# Patient Record
Sex: Male | Born: 1945 | Race: White | Hispanic: No | Smoking: Current every day smoker
Health system: Southern US, Community
[De-identification: ages and names within clinical notes are randomized; demographics above are authoritative.]

## PROBLEM LIST (undated history)

## (undated) DIAGNOSIS — I10 Essential (primary) hypertension: Secondary | ICD-10-CM

## (undated) DIAGNOSIS — E119 Type 2 diabetes mellitus without complications: Secondary | ICD-10-CM

## (undated) DIAGNOSIS — K449 Diaphragmatic hernia without obstruction or gangrene: Secondary | ICD-10-CM

## (undated) DIAGNOSIS — R0600 Dyspnea, unspecified: Secondary | ICD-10-CM

## (undated) DIAGNOSIS — K209 Esophagitis, unspecified without bleeding: Secondary | ICD-10-CM

## (undated) DIAGNOSIS — K2980 Duodenitis without bleeding: Secondary | ICD-10-CM

## (undated) DIAGNOSIS — C801 Malignant (primary) neoplasm, unspecified: Secondary | ICD-10-CM

## (undated) HISTORY — PX: CHOLECYSTECTOMY: SHX55

## (undated) HISTORY — PX: OTHER SURGICAL HISTORY: SHX169

## (undated) HISTORY — PX: EYE SURGERY: SHX253

---

## 2016-11-19 ENCOUNTER — Emergency Department (HOSPITAL_COMMUNITY): Payer: Medicare PPO

## 2016-11-19 ENCOUNTER — Observation Stay (HOSPITAL_COMMUNITY)
Admission: EM | Admit: 2016-11-19 | Discharge: 2016-11-21 | Disposition: A | Discharge status: Home / Self Care | Payer: Medicare PPO | Attending: Family Medicine | Admitting: Family Medicine

## 2016-11-19 ENCOUNTER — Encounter (HOSPITAL_COMMUNITY): Payer: Self-pay | Admitting: Emergency Medicine

## 2016-11-19 DIAGNOSIS — F17210 Nicotine dependence, cigarettes, uncomplicated: Secondary | ICD-10-CM | POA: Insufficient documentation

## 2016-11-19 DIAGNOSIS — Z515 Encounter for palliative care: Secondary | ICD-10-CM

## 2016-11-19 DIAGNOSIS — E119 Type 2 diabetes mellitus without complications: Secondary | ICD-10-CM | POA: Diagnosis not present

## 2016-11-19 DIAGNOSIS — E43 Unspecified severe protein-calorie malnutrition: Secondary | ICD-10-CM | POA: Diagnosis not present

## 2016-11-19 DIAGNOSIS — C189 Malignant neoplasm of colon, unspecified: Principal | ICD-10-CM | POA: Insufficient documentation

## 2016-11-19 DIAGNOSIS — Z79899 Other long term (current) drug therapy: Secondary | ICD-10-CM | POA: Insufficient documentation

## 2016-11-19 DIAGNOSIS — C787 Secondary malignant neoplasm of liver and intrahepatic bile duct: Secondary | ICD-10-CM | POA: Insufficient documentation

## 2016-11-19 DIAGNOSIS — D500 Iron deficiency anemia secondary to blood loss (chronic): Secondary | ICD-10-CM | POA: Diagnosis present

## 2016-11-19 DIAGNOSIS — D649 Anemia, unspecified: Secondary | ICD-10-CM | POA: Insufficient documentation

## 2016-11-19 DIAGNOSIS — N179 Acute kidney failure, unspecified: Secondary | ICD-10-CM | POA: Diagnosis present

## 2016-11-19 DIAGNOSIS — Z7189 Other specified counseling: Secondary | ICD-10-CM

## 2016-11-19 DIAGNOSIS — Z7982 Long term (current) use of aspirin: Secondary | ICD-10-CM | POA: Insufficient documentation

## 2016-11-19 DIAGNOSIS — R531 Weakness: Secondary | ICD-10-CM | POA: Diagnosis present

## 2016-11-19 HISTORY — DX: Esophagitis, unspecified: K20.9

## 2016-11-19 HISTORY — DX: Diaphragmatic hernia without obstruction or gangrene: K44.9

## 2016-11-19 HISTORY — DX: Type 2 diabetes mellitus without complications: E11.9

## 2016-11-19 HISTORY — DX: Esophagitis, unspecified without bleeding: K20.90

## 2016-11-19 HISTORY — DX: Duodenitis without bleeding: K29.80

## 2016-11-19 LAB — CBC WITH DIFFERENTIAL/PLATELET
BASOS PCT: 0 %
Basophils Absolute: 0 10*3/uL (ref 0.0–0.1)
EOS ABS: 0.1 10*3/uL (ref 0.0–0.7)
Eosinophils Relative: 1 %
HCT: 34.6 % — ABNORMAL LOW (ref 39.0–52.0)
Hemoglobin: 11.6 g/dL — ABNORMAL LOW (ref 13.0–17.0)
Lymphocytes Relative: 10 %
Lymphs Abs: 1.2 10*3/uL (ref 0.7–4.0)
MCH: 29.7 pg (ref 26.0–34.0)
MCHC: 33.5 g/dL (ref 30.0–36.0)
MCV: 88.7 fL (ref 78.0–100.0)
MONO ABS: 1.2 10*3/uL — AB (ref 0.1–1.0)
MONOS PCT: 10 %
NEUTROS PCT: 79 %
Neutro Abs: 8.7 10*3/uL — ABNORMAL HIGH (ref 1.7–7.7)
Platelets: 334 10*3/uL (ref 150–400)
RBC: 3.9 MIL/uL — ABNORMAL LOW (ref 4.22–5.81)
RDW: 12.7 % (ref 11.5–15.5)
WBC: 11.2 10*3/uL — ABNORMAL HIGH (ref 4.0–10.5)

## 2016-11-19 LAB — URINALYSIS, ROUTINE W REFLEX MICROSCOPIC
BILIRUBIN URINE: NEGATIVE
GLUCOSE, UA: NEGATIVE mg/dL
HGB URINE DIPSTICK: NEGATIVE
Ketones, ur: NEGATIVE mg/dL
Leukocytes, UA: NEGATIVE
Nitrite: NEGATIVE
PH: 5 (ref 5.0–8.0)
Protein, ur: NEGATIVE mg/dL
SPECIFIC GRAVITY, URINE: 1.013 (ref 1.005–1.030)

## 2016-11-19 LAB — COMPREHENSIVE METABOLIC PANEL
ALK PHOS: 297 U/L — AB (ref 38–126)
ALT: 25 U/L (ref 17–63)
AST: 47 U/L — AB (ref 15–41)
Albumin: 2.6 g/dL — ABNORMAL LOW (ref 3.5–5.0)
Anion gap: 16 — ABNORMAL HIGH (ref 5–15)
BUN: 43 mg/dL — AB (ref 6–20)
CALCIUM: 9.1 mg/dL (ref 8.9–10.3)
CO2: 22 mmol/L (ref 22–32)
CREATININE: 1.46 mg/dL — AB (ref 0.61–1.24)
Chloride: 93 mmol/L — ABNORMAL LOW (ref 101–111)
GFR calc non Af Amer: 47 mL/min — ABNORMAL LOW (ref >60)
GFR, EST AFRICAN AMERICAN: 55 mL/min — AB (ref >60)
GLUCOSE: 226 mg/dL — AB (ref 65–99)
Potassium: 3.4 mmol/L — ABNORMAL LOW (ref 3.5–5.1)
SODIUM: 131 mmol/L — AB (ref 135–145)
Total Bilirubin: 2.3 mg/dL — ABNORMAL HIGH (ref 0.3–1.2)
Total Protein: 6.9 g/dL (ref 6.5–8.1)

## 2016-11-19 LAB — LIPASE, BLOOD: Lipase: 30 U/L (ref 11–51)

## 2016-11-19 LAB — MAGNESIUM: Magnesium: 1.1 mg/dL — ABNORMAL LOW (ref 1.7–2.4)

## 2016-11-19 MED ORDER — PANTOPRAZOLE SODIUM 40 MG PO TBEC
40.0000 mg | DELAYED_RELEASE_TABLET | Freq: Every day | ORAL | Status: DC
  Administered 2016-11-20 – 2016-11-21 (×2): 40 mg via ORAL
  Filled 2016-11-19 (×2): qty 1

## 2016-11-19 MED ORDER — INSULIN ASPART 100 UNIT/ML ~~LOC~~ SOLN
0.0000 [IU] | Freq: Three times a day (TID) | SUBCUTANEOUS | Status: DC
  Administered 2016-11-20: 3 [IU] via SUBCUTANEOUS
  Administered 2016-11-20 – 2016-11-21 (×2): 2 [IU] via SUBCUTANEOUS

## 2016-11-19 MED ORDER — ENOXAPARIN SODIUM 40 MG/0.4ML ~~LOC~~ SOLN
40.0000 mg | SUBCUTANEOUS | Status: DC
  Administered 2016-11-19 – 2016-11-20 (×2): 40 mg via SUBCUTANEOUS
  Filled 2016-11-19 (×2): qty 0.4

## 2016-11-19 MED ORDER — IOPAMIDOL (ISOVUE-300) INJECTION 61%
80.0000 mL | Freq: Once | INTRAVENOUS | Status: AC | PRN
  Administered 2016-11-19: 80 mL via INTRAVENOUS

## 2016-11-19 MED ORDER — SODIUM CHLORIDE 0.9 % IV BOLUS (SEPSIS)
1000.0000 mL | Freq: Once | INTRAVENOUS | Status: AC
  Administered 2016-11-19: 1000 mL via INTRAVENOUS

## 2016-11-19 MED ORDER — ONDANSETRON HCL 4 MG/2ML IJ SOLN
4.0000 mg | Freq: Four times a day (QID) | INTRAMUSCULAR | Status: DC | PRN
  Administered 2016-11-20: 4 mg via INTRAVENOUS
  Filled 2016-11-19: qty 2

## 2016-11-19 MED ORDER — ACETAMINOPHEN 650 MG RE SUPP: 650.0000 mg | Freq: Four times a day (QID) | RECTAL | Status: DC | PRN

## 2016-11-19 MED ORDER — ONDANSETRON HCL 4 MG PO TABS: 4.0000 mg | ORAL_TABLET | Freq: Four times a day (QID) | ORAL | Status: DC | PRN

## 2016-11-19 MED ORDER — ENSURE ENLIVE PO LIQD
237.0000 mL | Freq: Two times a day (BID) | ORAL | Status: DC
  Administered 2016-11-20: 237 mL via ORAL

## 2016-11-19 MED ORDER — POTASSIUM CHLORIDE IN NACL 20-0.9 MEQ/L-% IV SOLN
INTRAVENOUS | Status: DC
  Administered 2016-11-19 – 2016-11-21 (×4): via INTRAVENOUS

## 2016-11-19 MED ORDER — ACETAMINOPHEN 325 MG PO TABS
650.0000 mg | ORAL_TABLET | Freq: Four times a day (QID) | ORAL | Status: DC | PRN
  Administered 2016-11-19: 650 mg via ORAL
  Filled 2016-11-19: qty 2

## 2016-11-19 NOTE — ED Notes (Signed)
Patient transported to CT 

## 2016-11-19 NOTE — ED Notes (Signed)
ED Provider at bedside. 

## 2016-11-19 NOTE — ED Provider Notes (Signed)
[facility] DEPT Provider Note   CSN: [id number] Arrival date & time: 11/19/16  1338     History   Chief Complaint Chief Complaint  Patient presents with  . Weakness    HPI Cory Robinson is a 71 y.o. male.  HPI  71 year old male, he reports a history of high blood pressure and high cholesterol, he does not have a family doctor, he lives in [name], he has been trying to get some evaluation over the last 6 weeks for what he describes as a lack of appetite, he has extreme early satiety and really has no desire to eat or drink anything. He has been losing weight, he feels as though he has been having excessive amounts of gas and gas pains which come and go, no difficulty urinating, no blood in the stools, he has excessive diarrhea though sometimes he is constipated.  In the early part of the evaluation over the last 6 weeks he had blood work done, he was not informed of the results, he had an ultrasound of his abdomen, he states he was not informed of the results. He had an endoscopy and was told that he had to start a new medication and was given dicyclomine as well as Protonix but again he states he was not given any information regarding his diagnoses. That being said his physicians have continued to order tests to further evaluate him, he reports he is set up to see a blood specialist though he does not know why, he has been seeing a gastroenterologist who has been evaluating his abdomen but whom he says has not communicated with him the results of these findings.  He presents today stating that he is tired of being sick which she has been for the last 6 weeks, he reports that being sick means that he doesn't want to eat and has cramping in his abdomen. He denies being jaundice, denies vomiting, denies swelling of his abdomen. He does report a weight loss, generalized weakness and nausea which is sometimes improved with Phenergan. The Phenergan was started after he told his  doctor that he was not improving with dicyclomine.  The patient has had a cholecystectomy in the past, he has never had any other abdominal surgery, denies a history of cancer.  Past Medical History:  Diagnosis Date  . Diabetes mellitus without complication ([name])   . Duodenitis   . Esophagitis   . Hiatal hernia     There are no active problems to display for this patient.   Past Surgical History:  Procedure Laterality Date  . CHOLECYSTECTOMY    . EYE SURGERY         Home Medications    Prior to Admission medications   Not on File    Family History History reviewed. No pertinent family history.  Social History Social History  Substance Use Topics  . Smoking status: Current Every Day Smoker    Packs/day: 1.00    Types: Cigarettes  . Smokeless tobacco: Never Used  . Alcohol use Yes     Comment: daily until 3 months ago     Allergies   Patient has no known allergies.   Review of Systems Review of Systems  All other systems reviewed and are negative.    Physical Exam Updated Vital Signs BP 110/70 (BP Location: Right Arm)   Pulse 81   Temp 98.1 F (36.7 C) (Oral)   Resp 20   Ht 5\' 7"  (1.702 m)   Wt 63.1 kg (  139 lb 3.2 oz)   SpO2 100%   BMI 21.80 kg/m   Physical Exam  Constitutional: He appears well-developed and well-nourished. No distress.  HENT:  Head: Normocephalic and atraumatic.  Mouth/Throat: Oropharynx is clear and moist. No oropharyngeal exudate.  Eyes: Pupils are equal, round, and reactive to light. Conjunctivae and EOM are normal. Right eye exhibits no discharge. Left eye exhibits no discharge. No scleral icterus.  Neck: Normal range of motion. Neck supple. No JVD present. No thyromegaly present.  Cardiovascular: Normal rate, regular rhythm, normal heart sounds and intact distal pulses.  Exam reveals no gallop and no friction rub.   No murmur heard. Pulmonary/Chest: Effort normal and breath sounds normal. No respiratory distress. He has  no wheezes. He has no rales.  Abdominal: Soft. Bowel sounds are normal. He exhibits no distension and no mass. There is no tenderness.  The patient has mild tenderness across the upper abdomen without guarding, there is no tympanitic sounds to percussion, no lower abdominal tenderness fullness or masses. No hernias in the inguinal areas. Bowel sounds are slightly increased  Musculoskeletal: Normal range of motion. He exhibits no edema or tenderness.  Lymphadenopathy:    He has no cervical adenopathy.  Neurological: He is alert. Coordination normal.  Skin: Skin is warm and dry. No rash noted. No erythema.  Psychiatric: He has a normal mood and affect. His behavior is normal.  Nursing note and vitals reviewed.    ED Treatments / Results  Labs (all labs ordered are listed, but only abnormal results are displayed) Labs Reviewed - No data to display   Radiology No results found.  Procedures Procedures (including critical care time)  Medications Ordered in ED Medications - No data to display   Initial Impression / Assessment and Plan / ED Course  I have reviewed the triage vital signs and the nursing notes.  Pertinent labs & imaging results that were available during my care of the patient were reviewed by me and considered in my medical decision making (see chart for details).     The patient's exam is rather unremarkable, he does have some upper abdominal tenderness but does not a surgical abdomen, the rest of his exam is benign with normal heart and lung sounds. Vital signs reflect no hypertension, hypotension, tachycardia, fever, tachypnea or hypoxia. We'll obtain labs and perform CT scan of the abdomen and pelvis to rule out cancer or other intra-abdominal sources of the patient's symptoms. He is well aware that we may not find any answers in which case he is willing to follow-up in the outpatient setting and continue his outpatient workup. Unfortunately we do not have access to  his prior testing or labs thus we will need to repeat some of these.  Slight WBC, Slight Anemia Lytes with mild hyponatremia and mild hypokalemia Cr at 1.46, BUN 40's Needs fluids - given LFT's are up slightly including bili  Change of shift - care signed out to Dr. Roderic Palau to f/u results and disposition accordingly.   Final Clinical Impressions(s) / ED Diagnoses   Final diagnoses:  None    New Prescriptions New Prescriptions   No medications on file     Noemi Chapel, MD 11/19/16 [phone]

## 2016-11-19 NOTE — ED Triage Notes (Signed)
Pt reports "I am just sick."  States he has been unable to eat since August and has been seeing doctors in [place] and does not have any answers.  Upper GI study showed esophagitis, duodenitis, and a hiatal hernia.  Cannot get in touch with his doctor due to power outage.  Does not have pcp.

## 2016-11-19 NOTE — H&P (Signed)
[facility] H&P    Patient Demographics:    Cory Robinson, is a 71 y.o. male  MRN: [id number]  DOB - July 08, 1945  Admit Date - 11/19/2016  Referring MD/NP/PA: Dr Roderic Palau  Outpatient Primary MD for the patient is Patient, [name] Per  Patient coming from: Home   Chief Complaint  Patient presents with  . Weakness      HPI:    Cory Robinson  is a 71 y.o. male,with history of hypertension, hyperlipidemia came to hospital after he has been having anorexia, weight loss, nausea since June of this year.patient also complains of intermittent abdominal pain, diarrhea alternating with constipation.  He denies vomiting. Denies chest pain Complains of shortness of breath on exertion Patient was seen by GI, underwent endoscopy, and was started on dicyclomine and Protonix.  In the ED today, patient was found to be in mild acute kidney injury. CT of the abdomen showed circumferential mass in the sigmoid colon consistent with colorectal carcinoma. Mass locally invasive into adjacent sigmoid colon. Widespread hepatic metastasis involving left and right hepatic lobes   Review of systems:       All other systems reviewed and are negative.   With Past History of the following :    Past Medical History:  Diagnosis Date  . Diabetes mellitus without complication ([place])   . Duodenitis   . Esophagitis   . Hiatal hernia       Past Surgical History:  Procedure Laterality Date  . CHOLECYSTECTOMY    . EYE SURGERY        Social History:      Social History  Substance Use Topics  . Smoking status: Current Every Day Smoker    Packs/day: 1.00    Types: Cigarettes  . Smokeless tobacco: Never Used  . Alcohol use Yes     Comment: daily until 3 months ago       Family History :    patient's mother had colon cancer, father had lymphoma   Home Medications:   Prior to Admission medications   Medication Sig Start  Date End Date Taking? Authorizing Provider  dicyclomine (BENTYL) 10 MG capsule Take 10 mg by mouth 4 (four) times daily -  before meals and at bedtime.  11/01/16  Yes [provider]  fluticasone (FLONASE) 50 MCG/ACT nasal spray Place 2 sprays into both nostrils daily.  08/23/16  Yes [provider]  losartan-hydrochlorothiazide (HYZAAR) 100-25 MG tablet Take 1 tablet by mouth daily.  01/26/09  Yes [provider]  pantoprazole (PROTONIX) 40 MG tablet Take 40 mg by mouth daily.  10/25/16  Yes [provider]  sildenafil (VIAGRA) 100 MG tablet Take 100 mg by mouth as needed.  01/08/09  Yes [provider]     Allergies:    No Known Allergies   Physical Exam:   Vitals  Blood pressure (!) 124/93, pulse 79, temperature 98.1 F (36.7 C), temperature source Oral, resp. rate 14, height 5\' 7"  (1.702 m), weight 63.1 kg (139 lb 3.2 oz), SpO2 100 %.  1.  General: Appears in no acute distress  2. Psychiatric:  Intact judgement and  insight, awake alert, oriented x 3.  3. Neurologic: No focal neurological deficits, all cranial nerves intact.Strength 5/5 all 4 extremities, sensation intact all 4 extremities, plantars down going.  4. Eyes :  anicteric sclerae, moist conjunctivae with no lid lag. PERRLA.  5. ENMT:  Oropharynx clear with moist mucous membranes and good dentition  6. Neck:  supple, no cervical lymphadenopathy appriciated, No thyromegaly  7. Respiratory : Normal respiratory effort, good air movement bilaterally,clear to  auscultation bilaterally  8. Cardiovascular : RRR, no gallops, rubs or murmurs, no leg edema  9. Gastrointestinal:  Positive bowel sounds, abdomen soft, non-tender to palpation,no hepatosplenomegaly, no rigidity or guarding       10. Skin:  No cyanosis, normal texture and turgor, no rash, lesions or ulcers  11.Musculoskeletal:  Good muscle tone,  joints appear normal , no effusions,  normal range of motion     Data Review:    CBC  Recent Labs Lab 11/19/16 1408  WBC 11.2*  HGB 11.6*  HCT 34.6*  PLT 334  MCV 88.7  MCH 29.7  MCHC 33.5  RDW 12.7  LYMPHSABS 1.2  MONOABS 1.2*  EOSABS 0.1  BASOSABS 0.0   ------------------------------------------------------------------------------------------------------------------  Chemistries   Recent Labs Lab 11/19/16 1408  NA 131*  K 3.4*  CL 93*  CO2 22  GLUCOSE 226*  BUN 43*  CREATININE 1.46*  CALCIUM 9.1  AST 47*  ALT 25  ALKPHOS 297*  BILITOT 2.3*   ------------------------------------------------------------------------------------------------------------------  ------------------------------------------------------------------------------------------------------------------ GFR: Estimated Creatinine Clearance: 42.6 mL/min (A) (by C-G formula based on SCr of 1.46 mg/dL (H)). Liver Function Tests:  Recent Labs Lab 11/19/16 1408  AST 47*  ALT 25  ALKPHOS 297*  BILITOT 2.3*  PROT 6.9  ALBUMIN 2.6*    Recent Labs Lab 11/19/16 1408  LIPASE 30   No results for input(s): AMMONIA in the last 168 hours. Coagulation Profile: No results for input(s): INR, PROTIME in the last 168 hours. Cardiac Enzymes: No results for input(s): CKTOTAL, CKMB, CKMBINDEX, TROPONINI in the last 168 hours. BNP (last 3 results) No results for input(s): PROBNP in the last 8760 hours. HbA1C: No results for input(s): HGBA1C in the last 72 hours. CBG: No results for input(s): GLUCAP in the last 168 hours. Lipid Profile: No results for input(s): CHOL, HDL, LDLCALC, TRIG, CHOLHDL, LDLDIRECT in the last 72 hours. Thyroid Function Tests: No results for input(s): TSH, T4TOTAL, FREET4, T3FREE, THYROIDAB in the last 72 hours. Anemia Panel: No results for input(s): VITAMINB12, FOLATE, FERRITIN, TIBC, IRON, RETICCTPCT in the last 72  hours.  --------------------------------------------------------------------------------------------------------------- Urine analysis:    Component Value Date/Time   COLORURINE YELLOW 11/19/2016 1410   APPEARANCEUR CLEAR 11/19/2016 1410   LABSPEC 1.013 11/19/2016 1410   PHURINE 5.0 11/19/2016 1410   GLUCOSEU NEGATIVE 11/19/2016 1410   HGBUR NEGATIVE 11/19/2016 1410   BILIRUBINUR NEGATIVE 11/19/2016 1410   KETONESUR NEGATIVE 11/19/2016 1410   PROTEINUR NEGATIVE 11/19/2016 1410   NITRITE NEGATIVE 11/19/2016 1410   LEUKOCYTESUR NEGATIVE 11/19/2016 1410      Imaging Results:    Ct Abdomen Pelvis W Contrast  Result Date: 11/19/2016 CLINICAL DATA:  Loss of appetite.  30 pound weight loss over 6 weeks EXAM: CT ABDOMEN AND PELVIS WITH CONTRAST TECHNIQUE: Multidetector CT imaging of the abdomen and pelvis was performed using the standard protocol following bolus administration of intravenous contrast. CONTRAST:  9mL ISOVUE-300 IOPAMIDOL (ISOVUE-300) INJECTION 61% COMPARISON:  None.  FINDINGS: Lower chest: 21 mm nodule at the LEFT lung base. Similar round lobe 11 mm nodule at the RIGHT lung base. Hepatobiliary: Large round peripherally enhancing hypodense lesions in the liver consistent metastatic disease. Large lesion in the RIGHT hepatic lobe measures 10.3 cm (image 52, series 6). More inferior RIGHT hepatic lobe lesion measures 7.3 cm (image 44, series 6). Lesions involve central LEFT hepatic lobe measuring 6.8 cm (image 18, series 3). Less involvement of the lateral LEFT hepatic lobe however there are several smaller lesions present. Pancreas: Pancreas is normal. No ductal dilatation. No pancreatic inflammation. Spleen: Normal spleen Adrenals/urinary tract: Adrenal glands are normal. LEFT kidney atrophic. RIGHT kidney normal Stomach/Bowel: Stomach, duodenum, small-bowel are normal. Appendix not identified. Ascending and transverse colon are normal. There is a mass lesion in the proximal sigmoid  colon measuring 6 cm in length and 4.2 cm in width (image 43, series 6). This circumferential bowel wall thickening is consistent malignancy. No evidence of bowel obstruction proximal to this circumferential mass. The more distal sigmoid colon rectum normal. The rectal mass extends into the mesenteric border of the mesocolon (image 47, series 7) Vascular/Lymphatic: Abdominal aortic normal caliber. Periaortic lymph node LEFT aorta measures 9 mm short axis. Small lymph nodes in the mesocolon are suspicious (image 46, series 9) Reproductive:  prostate is enlarged. Other: No peritoneal metastasis identified. Musculoskeletal: Multiple levels of endplate spurring and joint space narrowing. Endplate lesions are favors Schmorl's nodes. Note clear aggressive osseous lesion. IMPRESSION: 1. Circumferential mass in the sigmoid colon consistent with colorectal carcinoma. Mass locally invasive into the adjacent sigmoid mesocolon. No evidence of colon bowel obstruction at this time. 2. Widespread hepatic metastasis involving the LEFT and RIGHT hepatic lobes. 3. Rounded nodules at the LEFT and RIGHT lung base consistent with metastatic colorectal carcinoma. Findings conveyed toJoe Robinson on 11/19/2016  at16:04. Electronically Signed   By: Suzy Bouchard M.D.   On: 11/19/2016 16:05       Assessment & Plan:    Active Problems:   AKI (acute kidney injury) ([place])   1. Acute kidney injury- mild dehydration,will start IV normal saline at 100 ML per hour. Follow BMP in a.m. 2. Hypokalemia-potassium is 3.4, patient started on IV fluids with potassium. Check BMP in a.m. 3. Metastatic colon cancer-new diagnoses, will consult oncology in a.m. 4. Diabetes mellitus- start sliding scale insulin with NovoLog.    DVT Prophylaxis-   Lovenox   AM Labs Ordered, also please review Full Orders  Family Communication: Admission, patients condition and plan of care including tests being ordered have been discussed with the patient  and her daughter in law who indicate understanding and agree with the plan and Code Status.  Code Status:  Full code  Admission status: Observation  Time spent in minutes : 60 min   Hercules Hasler S M.D on 11/19/2016 at 5:04 PM  Between 7am to 7pm - Pager - [phone]. After 7pm go to [email or web address] - password [facility]  [facility] - Office  [phone]

## 2016-11-20 ENCOUNTER — Observation Stay (HOSPITAL_COMMUNITY): Payer: Medicare PPO

## 2016-11-20 DIAGNOSIS — E43 Unspecified severe protein-calorie malnutrition: Secondary | ICD-10-CM | POA: Diagnosis not present

## 2016-11-20 DIAGNOSIS — C7800 Secondary malignant neoplasm of unspecified lung: Secondary | ICD-10-CM | POA: Diagnosis not present

## 2016-11-20 DIAGNOSIS — C787 Secondary malignant neoplasm of liver and intrahepatic bile duct: Secondary | ICD-10-CM

## 2016-11-20 DIAGNOSIS — C189 Malignant neoplasm of colon, unspecified: Secondary | ICD-10-CM | POA: Diagnosis not present

## 2016-11-20 DIAGNOSIS — D500 Iron deficiency anemia secondary to blood loss (chronic): Secondary | ICD-10-CM | POA: Diagnosis present

## 2016-11-20 DIAGNOSIS — N179 Acute kidney failure, unspecified: Secondary | ICD-10-CM | POA: Diagnosis not present

## 2016-11-20 DIAGNOSIS — E860 Dehydration: Secondary | ICD-10-CM | POA: Diagnosis not present

## 2016-11-20 DIAGNOSIS — C801 Malignant (primary) neoplasm, unspecified: Secondary | ICD-10-CM

## 2016-11-20 DIAGNOSIS — E119 Type 2 diabetes mellitus without complications: Secondary | ICD-10-CM

## 2016-11-20 LAB — GLUCOSE, CAPILLARY
GLUCOSE-CAPILLARY: 120 mg/dL — AB (ref 65–99)
Glucose-Capillary: 160 mg/dL — ABNORMAL HIGH (ref 65–99)
Glucose-Capillary: 235 mg/dL — ABNORMAL HIGH (ref 65–99)
Glucose-Capillary: 232 mg/dL — ABNORMAL HIGH (ref 65–99)

## 2016-11-20 LAB — CBC
HEMATOCRIT: 30.3 % — AB (ref 39.0–52.0)
HEMOGLOBIN: 9.9 g/dL — AB (ref 13.0–17.0)
MCH: 29.5 pg (ref 26.0–34.0)
MCHC: 32.7 g/dL (ref 30.0–36.0)
MCV: 90.2 fL (ref 78.0–100.0)
Platelets: 285 10*3/uL (ref 150–400)
RBC: 3.36 MIL/uL — AB (ref 4.22–5.81)
RDW: 12.8 % (ref 11.5–15.5)
WBC: 8.5 10*3/uL (ref 4.0–10.5)

## 2016-11-20 LAB — COMPREHENSIVE METABOLIC PANEL
ALBUMIN: 2.2 g/dL — AB (ref 3.5–5.0)
ALK PHOS: 283 U/L — AB (ref 38–126)
ALT: 22 U/L (ref 17–63)
ANION GAP: 12 (ref 5–15)
AST: 45 U/L — ABNORMAL HIGH (ref 15–41)
BILIRUBIN TOTAL: 1.4 mg/dL — AB (ref 0.3–1.2)
BUN: 38 mg/dL — ABNORMAL HIGH (ref 6–20)
CALCIUM: 8.5 mg/dL — AB (ref 8.9–10.3)
CO2: 24 mmol/L (ref 22–32)
CREATININE: 1.29 mg/dL — AB (ref 0.61–1.24)
Chloride: 99 mmol/L — ABNORMAL LOW (ref 101–111)
GFR calc Af Amer: >60 mL/min (ref >60)
GFR calc non Af Amer: 55 mL/min — ABNORMAL LOW (ref >60)
GLUCOSE: 144 mg/dL — AB (ref 65–99)
Potassium: 3.7 mmol/L (ref 3.5–5.1)
SODIUM: 135 mmol/L (ref 135–145)
TOTAL PROTEIN: 6 g/dL — AB (ref 6.5–8.1)

## 2016-11-20 LAB — HEMOGLOBIN A1C
Hgb A1c MFr Bld: 7.5 % — ABNORMAL HIGH (ref 4.8–5.6)
MEAN PLASMA GLUCOSE: 168.55 mg/dL

## 2016-11-20 LAB — PREALBUMIN: Prealbumin: 5.3 mg/dL — ABNORMAL LOW (ref 18–38)

## 2016-11-20 MED ORDER — ENSURE ENLIVE PO LIQD
237.0000 mL | Freq: Three times a day (TID) | ORAL | Status: DC
  Administered 2016-11-20 – 2016-11-21 (×3): 237 mL via ORAL

## 2016-11-20 MED ORDER — IOPAMIDOL (ISOVUE-300) INJECTION 61%
75.0000 mL | Freq: Once | INTRAVENOUS | Status: AC | PRN
  Administered 2016-11-20: 75 mL via INTRAVENOUS

## 2016-11-20 MED ORDER — ADULT MULTIVITAMIN W/MINERALS CH
1.0000 | ORAL_TABLET | Freq: Every day | ORAL | Status: DC
  Administered 2016-11-20 – 2016-11-21 (×2): 1 via ORAL
  Filled 2016-11-20 (×2): qty 1

## 2016-11-20 NOTE — Consult Note (Addendum)
[facility] Consultation Oncology  Name: Cory Robinson      MRN: [id number]    Location: A305/A305-01  Date: 11/20/2016 Time:9:51 AM   REFERRING PHYSICIAN:  Dr. Darrick Meigs  REASON FOR CONSULT:  Metastatic colon cancer   DIAGNOSIS:  Suspected metastatic colon cancer  HISTORY OF PRESENT ILLNESS: 71 year old male initially presented for generalized weakness. Patient states he's had decreased appetite for the last 6 weeks. He's lost about 20 pounds in the past 3 months due to his decreased appetite. He states that he has been having a lot of diarrhea with intermittent constipation. CT abdomen and pelvis with contrast on 11/19/2016 demonstrated circumferential masses sigmoid colon consistent with colorectal carcinoma, mass is locally invasive into the adjacent sigmoid mesocolon, no evidence of colon bowel obstruction at the time, widespread hepatic metastasis involving the left and right hepatic lobes, and rounded nodules at the left and right lung bases consistent with metastatic colorectal carcinoma. He denies any chest pain, shortness of breath at rest. He has occasional lower abdominal pain. He also has occasional shortness of breath with exertion.  ROS as per HPI otherwise 14 point ROS is negative.   PAST MEDICAL HISTORY:   Past Medical History:  Diagnosis Date  . Diabetes mellitus without complication ([place])   . Duodenitis   . Esophagitis   . Hiatal hernia     ALLERGIES: No Known Allergies    MEDICATIONS: I have reviewed the patient's current medications.     PAST SURGICAL HISTORY Past Surgical History:  Procedure Laterality Date  . CHOLECYSTECTOMY    . EYE SURGERY      FAMILY HISTORY: History reviewed. No pertinent family history.  SOCIAL HISTORY:  reports that he has been smoking Cigarettes.  He has been smoking about 1.00 pack per day. He has never used smokeless tobacco. He reports that he drinks alcohol. He reports that he does not use drugs.  PERFORMANCE STATUS: The  patient's performance status is 2 - Symptomatic, <50% confined to bed  PHYSICAL EXAM: Most Recent Vital Signs: Blood pressure (!) 100/58, pulse 80, temperature 98.8 F (37.1 C), temperature source Oral, resp. rate 18, height _0  (1.702 m), weight 141 lb (64 kg), SpO2 98 %. Constitutional: Cachectic male in no distress.   HENT:  Head: Normocephalic and atraumatic.  Mouth/Throat: No oropharyngeal exudate. Mucosa moist. Eyes: Pupils are equal, round, and reactive to light. Conjunctivae are normal. No scleral icterus.  Neck: Normal range of motion. Neck supple. No JVD present.  Cardiovascular: Normal rate, regular rhythm and normal heart sounds.  Exam reveals no gallop and no friction rub.   No murmur heard. Pulmonary/Chest: Effort normal and breath sounds normal. No respiratory distress. No wheezes.No rales.  Abdominal: Soft. Bowel sounds are normal. No distension. There is no tenderness. There is no guarding.  Musculoskeletal: No edema or tenderness.  Lymphadenopathy:    No cervical or supraclavicular adenopathy.  Neurological: Alert and oriented to person, place, and time. No cranial nerve deficit.  Skin: Skin is warm and dry. No rash noted. No erythema. No pallor.  Psychiatric: Affect and judgment normal.    LABORATORY DATA:  Results for orders placed or performed during the hospital encounter of 11/19/16 (from the past 48 hour(s))  CBC with Differential/Platelet     Status: Abnormal   Collection Time: 11/19/16  2:08 PM  Result Value Ref Range   WBC 11.2 (H) 4.0 - 10.5 K/uL   RBC 3.90 (L) 4.22 - 5.81 MIL/uL   Hemoglobin 11.6 (  L) 13.0 - 17.0 g/dL   HCT 34.6 (L) 39.0 - 52.0 %   MCV 88.7 78.0 - 100.0 fL   MCH 29.7 26.0 - 34.0 pg   MCHC 33.5 30.0 - 36.0 g/dL   RDW 12.7 11.5 - 15.5 %   Platelets 334 150 - 400 K/uL   Neutrophils Relative % 79 %   Neutro Abs 8.7 (H) 1.7 - 7.7 K/uL   Lymphocytes Relative 10 %   Lymphs Abs 1.2 0.7 - 4.0 K/uL   Monocytes Relative 10 %   Monocytes  Absolute 1.2 (H) 0.1 - 1.0 K/uL   Eosinophils Relative 1 %   Eosinophils Absolute 0.1 0.0 - 0.7 K/uL   Basophils Relative 0 %   Basophils Absolute 0.0 0.0 - 0.1 K/uL  Comprehensive metabolic panel     Status: Abnormal   Collection Time: 11/19/16  2:08 PM  Result Value Ref Range   Sodium 131 (L) 135 - 145 mmol/L   Potassium 3.4 (L) 3.5 - 5.1 mmol/L   Chloride 93 (L) 101 - 111 mmol/L   CO2 22 22 - 32 mmol/L   Glucose, Bld 226 (H) 65 - 99 mg/dL   BUN 43 (H) 6 - 20 mg/dL   Creatinine, Ser 1.46 (H) 0.61 - 1.24 mg/dL   Calcium 9.1 8.9 - 10.3 mg/dL   Total Protein 6.9 6.5 - 8.1 g/dL   Albumin 2.6 (L) 3.5 - 5.0 g/dL   AST 47 (H) 15 - 41 U/L   ALT 25 17 - 63 U/L   Alkaline Phosphatase 297 (H) 38 - 126 U/L   Total Bilirubin 2.3 (H) 0.3 - 1.2 mg/dL   GFR calc non Af Amer 47 (L) >60 mL/min   GFR calc Af Amer 55 (L) >60 mL/min    Comment: (NOTE) The eGFR has been calculated using the CKD EPI equation. This calculation has not been validated in all clinical situations. eGFR's persistently <60 mL/min signify possible Chronic Kidney Disease.    Anion gap 16 (H) 5 - 15  Lipase, blood     Status: None   Collection Time: 11/19/16  2:08 PM  Result Value Ref Range   Lipase 30 11 - 51 U/L  Magnesium     Status: Abnormal   Collection Time: 11/19/16  2:08 PM  Result Value Ref Range   Magnesium 1.1 (L) 1.7 - 2.4 mg/dL  Urinalysis, Routine w reflex microscopic     Status: None   Collection Time: 11/19/16  2:10 PM  Result Value Ref Range   Color, Urine YELLOW YELLOW   APPearance CLEAR CLEAR   Specific Gravity, Urine 1.013 1.005 - 1.030   pH 5.0 5.0 - 8.0   Glucose, UA NEGATIVE NEGATIVE mg/dL   Hgb urine dipstick NEGATIVE NEGATIVE   Bilirubin Urine NEGATIVE NEGATIVE   Ketones, ur NEGATIVE NEGATIVE mg/dL   Protein, ur NEGATIVE NEGATIVE mg/dL   Nitrite NEGATIVE NEGATIVE   Leukocytes, UA NEGATIVE NEGATIVE  CBC     Status: Abnormal   Collection Time: 11/20/16  6:18 AM  Result Value Ref Range    WBC 8.5 4.0 - 10.5 K/uL   RBC 3.36 (L) 4.22 - 5.81 MIL/uL   Hemoglobin 9.9 (L) 13.0 - 17.0 g/dL   HCT 30.3 (L) 39.0 - 52.0 %   MCV 90.2 78.0 - 100.0 fL   MCH 29.5 26.0 - 34.0 pg   MCHC 32.7 30.0 - 36.0 g/dL   RDW 12.8 11.5 - 15.5 %   Platelets 285 150 -  400 K/uL  Comprehensive metabolic panel     Status: Abnormal   Collection Time: 11/20/16  6:18 AM  Result Value Ref Range   Sodium 135 135 - 145 mmol/L   Potassium 3.7 3.5 - 5.1 mmol/L   Chloride 99 (L) 101 - 111 mmol/L   CO2 24 22 - 32 mmol/L   Glucose, Bld 144 (H) 65 - 99 mg/dL   BUN 38 (H) 6 - 20 mg/dL   Creatinine, Ser 1.29 (H) 0.61 - 1.24 mg/dL   Calcium 8.5 (L) 8.9 - 10.3 mg/dL   Total Protein 6.0 (L) 6.5 - 8.1 g/dL   Albumin 2.2 (L) 3.5 - 5.0 g/dL   AST 45 (H) 15 - 41 U/L   ALT 22 17 - 63 U/L   Alkaline Phosphatase 283 (H) 38 - 126 U/L   Total Bilirubin 1.4 (H) 0.3 - 1.2 mg/dL   GFR calc non Af Amer 55 (L) >60 mL/min   GFR calc Af Amer >60 >60 mL/min    Comment: (NOTE) The eGFR has been calculated using the CKD EPI equation. This calculation has not been validated in all clinical situations. eGFR's persistently <60 mL/min signify possible Chronic Kidney Disease.    Anion gap 12 5 - 15  Glucose, capillary     Status: Abnormal   Collection Time: 11/20/16  8:00 AM  Result Value Ref Range   Glucose-Capillary 160 (H) 65 - 99 mg/dL   Comment 1 Notify RN    Comment 2 Document in Chart       RADIOGRAPHY: Ct Abdomen Pelvis W Contrast  Result Date: 11/19/2016 CLINICAL DATA:  Loss of appetite.  30 pound weight loss over 6 weeks EXAM: CT ABDOMEN AND PELVIS WITH CONTRAST TECHNIQUE: Multidetector CT imaging of the abdomen and pelvis was performed using the standard protocol following bolus administration of intravenous contrast. CONTRAST:  58m ISOVUE-300 IOPAMIDOL (ISOVUE-300) INJECTION 61% COMPARISON:  None. FINDINGS: Lower chest: 21 mm nodule at the LEFT lung base. Similar round lobe 11 mm nodule at the RIGHT lung base.  Hepatobiliary: Large round peripherally enhancing hypodense lesions in the liver consistent metastatic disease. Large lesion in the RIGHT hepatic lobe measures 10.3 cm (image 52, series 6). More inferior RIGHT hepatic lobe lesion measures 7.3 cm (image 44, series 6). Lesions involve central LEFT hepatic lobe measuring 6.8 cm (image 18, series 3). Less involvement of the lateral LEFT hepatic lobe however there are several smaller lesions present. Pancreas: Pancreas is normal. No ductal dilatation. No pancreatic inflammation. Spleen: Normal spleen Adrenals/urinary tract: Adrenal glands are normal. LEFT kidney atrophic. RIGHT kidney normal Stomach/Bowel: Stomach, duodenum, small-bowel are normal. Appendix not identified. Ascending and transverse colon are normal. There is a mass lesion in the proximal sigmoid colon measuring 6 cm in length and 4.2 cm in width (image 43, series 6). This circumferential bowel wall thickening is consistent malignancy. No evidence of bowel obstruction proximal to this circumferential mass. The more distal sigmoid colon rectum normal. The rectal mass extends into the mesenteric border of the mesocolon (image 47, series 7) Vascular/Lymphatic: Abdominal aortic normal caliber. Periaortic lymph node LEFT aorta measures 9 mm short axis. Small lymph nodes in the mesocolon are suspicious (image 46, series 9) Reproductive:  prostate is enlarged. Other: No peritoneal metastasis identified. Musculoskeletal: Multiple levels of endplate spurring and joint space narrowing. Endplate lesions are favors Schmorl's nodes. Note clear aggressive osseous lesion. IMPRESSION: 1. Circumferential mass in the sigmoid colon consistent with colorectal carcinoma. Mass locally invasive into the adjacent  sigmoid mesocolon. No evidence of colon bowel obstruction at this time. 2. Widespread hepatic metastasis involving the LEFT and RIGHT hepatic lobes. 3. Rounded nodules at the LEFT and RIGHT lung base consistent with  metastatic colorectal carcinoma. Findings conveyed toJoe Zammitt on 11/19/2016  at16:04. Electronically Signed   By: Suzy Bouchard M.D.   On: 11/19/2016 16:05       ASSESSMENT: 71 year old male who initially presented for generalized weakness, found on CT abd/pelvis to have sigmoid colon mass with suspected widespread liver and lung mets.  PLAN:  -I have reviewed with the patient and his son at bedside, the suspected diagnosis of metastatic stage IV colon cancer. I have reviewed with them that since he has suspected stage IV cancer, he will not be curable but he can receive palliative chemotherapy. Patient states that he would be interested in chemo. -I would recommend outpatient biopsy of liver mass for confirmatory diagnosis, which I can set up once he comes to see me as an outpatient.  -He will need an outpatient chemoport which I will set up as well once he sees [place] as an outpatient.. -Check a CEA level. -Recommend to get a stat CT chest with contrast to complete staging. -I have set him up an appointment to come see [place] in our cancer center on 11/30/16 at 8:30 am.    All questions were answered. The patient knows to call the clinic with any problems, questions or concerns. We can certainly see the patient much sooner if necessary.   Twana First, MD    Addendum: Came back to see the patient again because he had more questions about chemotherapy. Discussed potential palliative treatment with FOLFOX +/- avastin q2 weeks in detail with the patient and his son, including administration, scheduling, and side effects. I have discussed the option of hospice for comfort measures if he decides he does not want chemo. The other option is to start chemo and if he feels like chemotherapy side effects is too much, he always has the option to stop treatment. Patient states he needs more time to think about this. Recommend to get palliative care involved so that they may talk to the patient. All of their  questions were answered to their satisfaction.  Twana First, MD 11/20/16 11:23

## 2016-11-20 NOTE — Progress Notes (Signed)
Initial Nutrition Assessment  DOCUMENTATION CODES:   Severe malnutrition in context of chronic illness  INTERVENTION:  Ensure Enlive po BID, each supplement provides 350 kcal and 20 grams of protein   Add Multivitamin daily  Magic cup with lunch daily  Recommend obtain 3-daily wt to establish/confirm his current baseline   NUTRITION DIAGNOSIS:   Malnutrition (Severe) related to catabolic illness (Newly diagnosed colon cancer with widespread metastasis to liver) as evidenced by estimated needs, moderate depletion of body fat, moderate depletions of muscle mass.  GOAL:   (Patient is unsure of whether he would like to proceed with palliative chemo which will impact his nutrition goals). He is asking to speak again to someone from oncology who will explain the potential affects of palliative chemo. Tearfully, he says "I don't want to pursue an therapy that will make me feel worse".  MONITOR:   PO intake, Supplement acceptance, Weight trends, Labs  REASON FOR ASSESSMENT:   Consult Assessment of nutrition requirement/status  ASSESSMENT:  The patient is a 71 yo who presents with newly diagnosed colon cancer with metastasis to liver.  Dr Talbert Cage was in this morning to talk with patient. When RD arrived he is tearful and now is not sure whether he want to pursue chemotherapy. Discussed this with his nurse Claiborne Billings) who is going to inform oncology that he needs further details about expected side effects from palliative chemo.  The patient says he has been eating very little the past 3 months due to "no appetite". His son recently purchased him oral supplement to drink Ensure or Boost. His weight is down significantly (17%) during this time from usual of 77 kg to current 64 kg. He has mild to moderate fat and muscle depletions and no edema noted. He meets criteria for severe malnutrition. We talked about ways to improve/increase his daily nutrition through oral supplements while his appetite is  so poor. Labs and meds reviewed.   Diet Order:  Diet Carb Modified Fluid consistency: Thin; Room service appropriate? Yes  Skin:  Reviewed, no issues  Last BM:  unknown  Height:   Ht Readings from Last 1 Encounters:  11/19/16 5\' 7"  (1.702 m)    Weight:   Wt Readings from Last 1 Encounters:  11/19/16 141 lb (64 kg)    Ideal Body Weight:  67 kg  BMI:  Body mass index is 22.08 kg/m.  Estimated Nutritional Needs:   Kcal:  1920-2112 (30-33 kcal/kg)  Protein:  77-83 gr   Fluid:  > 1600 ml daily  EDUCATION NEEDS:   Education needs addressed (Guideance related to how to improve nutrition density on a primarily liquid diet)  Colman Cater MS,RD,CSG,LDN Office: [phone] Pager: [phone]

## 2016-11-20 NOTE — Progress Notes (Signed)
PROGRESS NOTE    Josian Lanese  ZOX:[id number]  DOB: Oct 02, 1945  DOA: 11/19/2016 PCP: Patient, [name] Per   Brief Admission Hx: Nghia Mcentee  is a 71 y.o. male,with history of hypertension, hyperlipidemia came to hospital after he has been having anorexia, weight loss, nausea since June of this year.patient also complains of intermittent abdominal pain, diarrhea alternating with constipation. In the ED today, patient was found to be in mild acute kidney injury. CT of the abdomen showed circumferential mass in the sigmoid colon consistent with colorectal carcinoma. Mass locally invasive into adjacent sigmoid colon.  Widespread hepatic metastasis involving left and right hepatic lobes  MDM/Assessment & Plan:   1. Metastatic colon cancer - oncology consult pending.  Continue supportive care.  2. Hypokalemia - corrected with IVF replacement.  3. Hyponatremia - improved with IVF.  4. Hypoalbuminemia-Consult dietitian.  5. Anemia, likely from chronic blood loss - Following. Acute drop likely hemodilution.  6. AKI - improving with IVF hydration.  7. Diabetes mellitus, type 2 - continue sliding scale coverage.   DVT prophylaxis: lovenox Code Status: Full  Family Communication:  Disposition Plan: TBD  Consultants:  oncology  Subjective: Pt without complaints, says he does feel better with IVFs.  He is eating and drinking at bedside.  He denies pain.    Objective: Vitals:   11/19/16 1826 11/19/16 1948 11/20/16 0514 11/20/16 0800  BP: 113/68 (!) 116/59 112/65 (!) 100/58  Pulse: 93 95 65 80  Resp: 18 18 18 18   Temp: 98.3 F (36.8 C) (!) 100.6 F (38.1 C) 98.2 F (36.8 C) 98.8 F (37.1 C)  TempSrc: Oral Oral Oral Oral  SpO2: 100% 99% 100% 98%  Weight: 64 kg (141 lb)     Height: 5\' 7"  (1.702 m)       Intake/Output Summary (Last 24 hours) at 11/20/16 0830 Last data filed at 11/20/16 0600  Gross per 24 hour  Intake          2231.67 ml  Output                0 ml  Net           2231.67 ml   Filed Weights   11/19/16 1347 11/19/16 1350 11/19/16 1826  Weight: 64 kg (141 lb) 63.1 kg (139 lb 3.2 oz) 64 kg (141 lb)   REVIEW OF SYSTEMS  As per history otherwise all reviewed and reported negative  Exam:  General exam: awake, alert, chronically ill appearing and emaciated appearing.   Respiratory system: Clear. No increased work of breathing. Cardiovascular system: S1 & S2 heard, RRR. No JVD, murmurs, gallops, clicks or pedal edema. Gastrointestinal system: Abdomen is nondistended, soft. Normal bowel sounds heard. Central nervous system: Alert and oriented. No focal neurological deficits. Extremities: no CCE.  Data Reviewed: Basic Metabolic Panel:  Recent Labs Lab 11/19/16 1408 11/20/16 0618  NA 131* 135  K 3.4* 3.7  CL 93* 99*  CO2 22 24  GLUCOSE 226* 144*  BUN 43* 38*  CREATININE 1.46* 1.29*  CALCIUM 9.1 8.5*  MG 1.1*  --    Liver Function Tests:  Recent Labs Lab 11/19/16 1408 11/20/16 0618  AST 47* 45*  ALT 25 22  ALKPHOS 297* 283*  BILITOT 2.3* 1.4*  PROT 6.9 6.0*  ALBUMIN 2.6* 2.2*    Recent Labs Lab 11/19/16 1408  LIPASE 30   No results for input(s): AMMONIA in the last 168 hours. CBC:  Recent Labs Lab 11/19/16 1408 11/20/16 0618  WBC 11.2* 8.5  NEUTROABS 8.7*  --   HGB 11.6* 9.9*  HCT 34.6* 30.3*  MCV 88.7 90.2  PLT 334 285   Cardiac Enzymes: No results for input(s): CKTOTAL, CKMB, CKMBINDEX, TROPONINI in the last 168 hours. CBG (last 3)   Recent Labs  11/20/16 0800  GLUCAP 160*   No results found for this or any previous visit (from the past 240 hour(s)).   Studies: Ct Abdomen Pelvis W Contrast  Result Date: 11/19/2016 CLINICAL DATA:  Loss of appetite.  30 pound weight loss over 6 weeks EXAM: CT ABDOMEN AND PELVIS WITH CONTRAST TECHNIQUE: Multidetector CT imaging of the abdomen and pelvis was performed using the standard protocol following bolus administration of intravenous contrast. CONTRAST:  27mL  ISOVUE-300 IOPAMIDOL (ISOVUE-300) INJECTION 61% COMPARISON:  None. FINDINGS: Lower chest: 21 mm nodule at the LEFT lung base. Similar round lobe 11 mm nodule at the RIGHT lung base. Hepatobiliary: Large round peripherally enhancing hypodense lesions in the liver consistent metastatic disease. Large lesion in the RIGHT hepatic lobe measures 10.3 cm (image 52, series 6). More inferior RIGHT hepatic lobe lesion measures 7.3 cm (image 44, series 6). Lesions involve central LEFT hepatic lobe measuring 6.8 cm (image 18, series 3). Less involvement of the lateral LEFT hepatic lobe however there are several smaller lesions present. Pancreas: Pancreas is normal. No ductal dilatation. No pancreatic inflammation. Spleen: Normal spleen Adrenals/urinary tract: Adrenal glands are normal. LEFT kidney atrophic. RIGHT kidney normal Stomach/Bowel: Stomach, duodenum, small-bowel are normal. Appendix not identified. Ascending and transverse colon are normal. There is a mass lesion in the proximal sigmoid colon measuring 6 cm in length and 4.2 cm in width (image 43, series 6). This circumferential bowel wall thickening is consistent malignancy. No evidence of bowel obstruction proximal to this circumferential mass. The more distal sigmoid colon rectum normal. The rectal mass extends into the mesenteric border of the mesocolon (image 47, series 7) Vascular/Lymphatic: Abdominal aortic normal caliber. Periaortic lymph node LEFT aorta measures 9 mm short axis. Small lymph nodes in the mesocolon are suspicious (image 46, series 9) Reproductive:  prostate is enlarged. Other: No peritoneal metastasis identified. Musculoskeletal: Multiple levels of endplate spurring and joint space narrowing. Endplate lesions are favors Schmorl's nodes. Note clear aggressive osseous lesion. IMPRESSION: 1. Circumferential mass in the sigmoid colon consistent with colorectal carcinoma. Mass locally invasive into the adjacent sigmoid mesocolon. No evidence of  colon bowel obstruction at this time. 2. Widespread hepatic metastasis involving the LEFT and RIGHT hepatic lobes. 3. Rounded nodules at the LEFT and RIGHT lung base consistent with metastatic colorectal carcinoma. Findings conveyed toJoe Zammitt on 11/19/2016  at16:04. Electronically Signed   By: Suzy Bouchard M.D.   On: 11/19/2016 16:05   Scheduled Meds: . enoxaparin (LOVENOX) injection  40 mg Subcutaneous Q24H  . feeding supplement (ENSURE ENLIVE)  237 mL Oral BID BM  . insulin aspart  0-9 Units Subcutaneous TID WC  . pantoprazole  40 mg Oral Daily   Continuous Infusions: . 0.9 % NaCl with KCl 20 mEq / L 100 mL/hr at 11/20/16 0600   Active Problems:   AKI (acute kidney injury) ([place])  Time spent:   Irwin Brakeman, MD, FAAFP [facility] Pager [phone]  If 7PM-7AM, please contact night-coverage [email or web address] Password [email or web address] 11/20/2016, 8:30 AM    LOS: 0 days

## 2016-11-20 NOTE — Care Management Obs Status (Signed)
[facility] NOTIFICATION   Patient Details  Name: Tomaz Janis MRN: [id number] Date of Birth: 1945-12-22   Medicare Observation Status Notification Given:  Yes    Disa Riedlinger, Chauncey [place], RN 11/20/2016, 1:10 PM

## 2016-11-21 ENCOUNTER — Encounter (HOSPITAL_COMMUNITY): Payer: Self-pay | Admitting: Primary Care

## 2016-11-21 DIAGNOSIS — C787 Secondary malignant neoplasm of liver and intrahepatic bile duct: Secondary | ICD-10-CM | POA: Diagnosis not present

## 2016-11-21 DIAGNOSIS — Z7189 Other specified counseling: Secondary | ICD-10-CM

## 2016-11-21 DIAGNOSIS — D500 Iron deficiency anemia secondary to blood loss (chronic): Secondary | ICD-10-CM | POA: Diagnosis not present

## 2016-11-21 DIAGNOSIS — Z515 Encounter for palliative care: Secondary | ICD-10-CM | POA: Diagnosis not present

## 2016-11-21 DIAGNOSIS — C189 Malignant neoplasm of colon, unspecified: Secondary | ICD-10-CM | POA: Diagnosis not present

## 2016-11-21 DIAGNOSIS — E119 Type 2 diabetes mellitus without complications: Secondary | ICD-10-CM

## 2016-11-21 DIAGNOSIS — E43 Unspecified severe protein-calorie malnutrition: Secondary | ICD-10-CM

## 2016-11-21 DIAGNOSIS — N179 Acute kidney failure, unspecified: Secondary | ICD-10-CM | POA: Diagnosis not present

## 2016-11-21 LAB — CBC
HCT: 30.7 % — ABNORMAL LOW (ref 39.0–52.0)
Hemoglobin: 10 g/dL — ABNORMAL LOW (ref 13.0–17.0)
MCH: 29.4 pg (ref 26.0–34.0)
MCHC: 32.6 g/dL (ref 30.0–36.0)
MCV: 90.3 fL (ref 78.0–100.0)
PLATELETS: 282 10*3/uL (ref 150–400)
RBC: 3.4 MIL/uL — AB (ref 4.22–5.81)
RDW: 13.1 % (ref 11.5–15.5)
WBC: 7.8 10*3/uL (ref 4.0–10.5)

## 2016-11-21 LAB — COMPREHENSIVE METABOLIC PANEL
ALK PHOS: 266 U/L — AB (ref 38–126)
ALT: 20 U/L (ref 17–63)
AST: 44 U/L — ABNORMAL HIGH (ref 15–41)
Albumin: 2 g/dL — ABNORMAL LOW (ref 3.5–5.0)
Anion gap: 11 (ref 5–15)
BILIRUBIN TOTAL: 1.1 mg/dL (ref 0.3–1.2)
BUN: 28 mg/dL — ABNORMAL HIGH (ref 6–20)
CALCIUM: 8 mg/dL — AB (ref 8.9–10.3)
CO2: 22 mmol/L (ref 22–32)
CREATININE: 1.07 mg/dL (ref 0.61–1.24)
Chloride: 102 mmol/L (ref 101–111)
Glucose, Bld: 159 mg/dL — ABNORMAL HIGH (ref 65–99)
Potassium: 3.8 mmol/L (ref 3.5–5.1)
Sodium: 135 mmol/L (ref 135–145)
Total Protein: 5.5 g/dL — ABNORMAL LOW (ref 6.5–8.1)

## 2016-11-21 LAB — GLUCOSE, CAPILLARY
GLUCOSE-CAPILLARY: 164 mg/dL — AB (ref 65–99)
Glucose-Capillary: 252 mg/dL — ABNORMAL HIGH (ref 65–99)

## 2016-11-21 MED ORDER — ENSURE ENLIVE PO LIQD: 237.0000 mL | Freq: Three times a day (TID) | ORAL | 0 refills | Status: AC

## 2016-11-21 MED ORDER — SENNOSIDES-DOCUSATE SODIUM 8.6-50 MG PO TABS: 2.0000 | ORAL_TABLET | Freq: Every day | ORAL | Status: DC

## 2016-11-21 MED ORDER — SENNA 8.6 MG PO CAPS: 1.0000 | ORAL_CAPSULE | Freq: Every day | ORAL | 0 refills | Status: AC | PRN

## 2016-11-21 MED ORDER — ADULT MULTIVITAMIN W/MINERALS CH: 1.0000 | ORAL_TABLET | Freq: Every day | ORAL | Status: AC

## 2016-11-21 NOTE — Progress Notes (Signed)
Inpatient Diabetes Program Recommendations  AACE/ADA: New Consensus Statement on Inpatient Glycemic Control (2015)  Target Ranges:  Prepandial:   less than 140 mg/dL      Peak postprandial:   less than 180 mg/dL (1-2 hours)      Critically ill patients:  140 - 180 mg/dL  Results for HAMMOCKKohle, Winner (MRN [id number]) as of 11/21/2016 08:21  Ref. Range 11/20/2016 08:00 11/20/2016 11:53 11/20/2016 16:48 11/20/2016 20:56 11/21/2016 07:35  Glucose-Capillary Latest Ref Range: 65 - 99 mg/dL 160 (H) 235 (H) 120 (H) 232 (H) 164 (H)    Review of Glycemic Control  Diabetes history: DM2 Outpatient Diabetes medications: None Current orders for Inpatient glycemic control: Novolog 0-9 units TID with meals  Inpatient Diabetes Program Recommendations: Correction (SSI): Please consider ordering Novolog 0-5 units QHS for bedtime correction.  Thanks, Barnie Alderman, RN, MSN, CDE Diabetes Coordinator Inpatient Diabetes Program [phone] (Team Pager from 8am to 5pm)

## 2016-11-21 NOTE — Consult Note (Signed)
Consultation Note Date: 11/21/2016   Patient Name: Cory Robinson  DOB: Apr 01, 1945  MRN: [id number]  Age / Sex: 71 y.o., male  PCP: Patient, [name] Per Referring Physician: Murlean Iba, MD  Reason for Consultation: Establishing goals of care and Psychosocial/spiritual support  HPI/Patient Profile: 71 y.o. male  with past medical history of high blood pressure and cholesterol, new diagnosis of colon cancer with widespread hepatic metastasis, and nodules to the base of left and right lung admitted on 11/19/2016 with weakness.   Clinical Assessment and Goals of Care: Cory Robinson is resting quietly in bed, he greets me making and keeping eye contact.  Present today at bedside is Cory Robinson. We talk about Cory Robinson current diagnosis. We talk about his functional status. Cory Robinson tells the storyof a neighbor who had cancer. He states that his neighbor had chemo, and it helped at 5st, but his neighbor decided to not take chemotherapy any longer, and died after a couple of years.  We talk about Cory Robinson preferred place of death.  He states that he wants to die "with my boots on".  Cory Robinson retired 4 years ago, but has continued to work part-time until around 4 months ago.   We talk about healthcare power of attorney (see below). We also talk about advance directives (see below).   We talk about options, and what they may look like.  At this point Cory Robinson wishes to follow up with oncology at end of month. I encourage him to have Cory Robinson go to appointments with him.  We talk about biopsy, how chemotherapy is chosen, port placement. We talk about possible side effects, and further testing.  We also talk in detail about hospice in home services. I share that hospice is no cost in the home, he is already paid for it as a Medicare benefit. We talk about hospice "doing it his way". We talk about the  benefits of symptom management and support.  Cory Robinson asks if he can ask a "hard question", and asks about prognosis. Prognosis discussed with permission. A diagram of the chronic illness pathway shared; also the chronic illness pathway for those with cancer. We talk about the changes that occur near end of life, including sleeping more, eating and interacting less. I share that I anticipate Cory Robinson to have further functional decline, further weight loss, and he may only feel like drinking, and not eating. I encourage him to stay hydrated.  Cory Robinson states that he works nights, and is planning on spending much time with Cory Robinson.  I encourage them to call at any time with questions. I share that we can have another meeting in my office when they wish.  Healthcare power of attorney OTHER - we discuss healthcare power of attorney in detail. Cory Robinson states that he wants his stepson, Cory Robinson, to be his surrogate decision-maker. We talk about the importance of completing paperwork.    SUMMARY OF RECOMMENDATIONS   Cory Robinson is taking his time to  weigh options.  He desires to keep his appointment with oncology. At this point he is leaning toward biopsy. Unsure if he will take chemotherapy.  We also discuss the benefits of hospice in the home in detail. Cory Robinson is also agreeable to a preliminary conference with [facility].   Code Status/Advance Care Planning: DNR  - we discussed the concepts of "allow a natural death".  Healthcare surrogate, Cory Robinson states that he can abide Cory Robinson wishes.  Symptom Management:   Per hospitalist  Bowel regimen added  Palliative Prophylaxis:   Bowel Regimen  Additional Recommendations (Limitations, Scope, Preferences):  at this point, continue to treat the treatable, no CPR no intubation  Psycho-social/Spiritual:   Desire for further Chaplaincy support:yes, requesting healthcare power of attorney form.    Additional Recommendations: Caregiving  Support/Resources and Education on Hospice  Prognosis:   < 6 months, would not be surprising based on 30 pound weight loss in 4 months, functional decline, metastatic cancer burden.   Discharge Planning: home, follow up with oncology      Primary Diagnoses: Present on Admission: . AKI (acute kidney injury) ([place]) . Metastatic colon cancer to liver ([place]) . Anemia due to chronic blood loss . Severe protein-calorie malnutrition ([place])   I have reviewed the medical record, interviewed the patient and family, and examined the patient. The following aspects are pertinent.  Past Medical History:  Diagnosis Date  . Diabetes mellitus without complication ([place])   . Duodenitis   . Esophagitis   . Hiatal hernia    Social History   Social History  . Marital status: Divorced    Spouse name: N/A  . Number of children: N/A  . Years of education: N/A   Social History Main Topics  . Smoking status: Current Every Day Smoker    Packs/day: 1.00    Types: Cigarettes  . Smokeless tobacco: Never Used  . Alcohol use Yes     Comment: daily until 3 months ago  . Drug use: No  . Sexual activity: Not Asked   Other Topics Concern  . None   Social History Narrative  . None   History reviewed. No pertinent family history. Scheduled Meds: . enoxaparin (LOVENOX) injection  40 mg Subcutaneous Q24H  . feeding supplement (ENSURE ENLIVE)  237 mL Oral TID BM  . insulin aspart  0-9 Units Subcutaneous TID WC  . multivitamin with minerals  1 tablet Oral Daily  . pantoprazole  40 mg Oral Daily  . senna-docusate  2 tablet Oral QHS   Continuous Infusions: . 0.9 % NaCl with KCl 20 mEq / L Stopped (11/21/16 1015)   PRN Meds:.acetaminophen **OR** acetaminophen, ondansetron **OR** ondansetron (ZOFRAN) IV Medications Prior to Admission:  Prior to Admission medications   Medication Sig Start Date End Date Taking? Authorizing Provider  aspirin EC 81 MG tablet  Take 81 mg by mouth daily.   Yes [provider]  gemfibrozil (LOPID) 600 MG tablet Take 600 mg by mouth daily.   Yes [provider]  losartan-hydrochlorothiazide (HYZAAR) 100-25 MG tablet Take 1 tablet by mouth daily.  01/26/09  Yes [provider]  pantoprazole (PROTONIX) 40 MG tablet Take 40 mg by mouth daily.  10/25/16  Yes [provider]  promethazine (PHENERGAN) 12.5 MG tablet Take 12.5 mg by mouth every 6 (six) hours as needed for nausea or vomiting.   Yes [provider]  vitamin B-12 (CYANOCOBALAMIN) 1000 MCG tablet Take 1,000 mcg by mouth daily.  Yes [provider]  feeding supplement, ENSURE ENLIVE, (ENSURE ENLIVE) LIQD Take 237 mLs by mouth 3 (three) times daily between meals. 11/21/16 11/28/16  Cory Iba, MD  Multiple Vitamin (MULTIVITAMIN WITH MINERALS) TABS tablet Take 1 tablet by mouth daily. 11/22/16   Johnson, Clanford L, MD  Sennosides (SENNA) 8.6 MG CAPS Take 1 capsule by mouth daily as needed. 11/21/16   Cory Iba, MD   No Known Allergies Review of Systems  Unable to perform ROS: Other    Physical Exam  Constitutional: He is oriented to person, place, and time. No distress.  Makes and keeps eye contact, calm and cooperative  HENT:  Head: Atraumatic.  Cardiovascular: Normal rate.   Pulmonary/Chest: Effort normal. No respiratory distress.  Abdominal: Soft. He exhibits distension. There is no tenderness.  Musculoskeletal: He exhibits no edema.  Neurological: He is alert and oriented to person, place, and time.  Skin: Skin is warm and dry.  Nursing note and vitals reviewed.   Vital Signs: BP (!) 100/59 (BP Location: Right Arm)   Pulse 61   Temp 98 F (36.7 C) (Oral)   Resp 16   Ht 5\' 7"  (1.702 m)   Wt 64 kg (141 lb)   SpO2 99%   BMI 22.08 kg/m  Pain Assessment: No/denies pain   Pain Score: 0-No pain   SpO2: SpO2: 99 % O2 Device:SpO2: 99 % O2 Flow Rate: .   IO: Intake/output  summary:  Intake/Output Summary (Last 24 hours) at 11/21/16 1110 Last data filed at 11/21/16 0900  Gross per 24 hour  Intake          1326.67 ml  Output                0 ml  Net          1326.67 ml    LBM: Last BM Date: 11/20/16 Baseline Weight: Weight: 64 kg (141 lb) Most recent weight: Weight: 64 kg (141 lb)     Palliative Assessment/Data:   Flowsheet Rows     Most Recent Value  Intake Tab  Referral Department  Oncology  Unit at Time of Referral  Med/Surg Unit  Palliative Care Primary Diagnosis  Cancer  Date Notified  11/20/16  Palliative Care Type  New Palliative care  Reason for referral  Clarify Goals of Care, Counsel Regarding Hospice, Psychosocial or Spiritual support  Date of Admission  11/19/16  Date first seen by Palliative Care  11/21/16  # of days Palliative referral response time  1 Day(s)  # of days IP prior to Palliative referral  1  Clinical Assessment  Palliative Performance Scale Score  60%  Pain Max last 24 hours  Not able to report  Pain Min Last 24 hours  Not able to report  Dyspnea Max Last 24 Hours  Not able to report  Dyspnea Min Last 24 hours  Not able to report  Psychosocial & Spiritual Assessment  Palliative Care Outcomes  Patient/Family meeting held?  Yes  Who was at the meeting?  patient and step son Cory Robinson at bedside.   Palliative Care Outcomes  Counseled regarding hospice, Provided advance care planning, Completed durable DNR, Clarified goals of care, Changed CPR status, Provided psychosocial or spiritual support  Patient/Family wishes: Interventions discontinued/not started   Mechanical Ventilation      Time In: 0950 Time Out: 1040 Time Total: 50 minutes Greater than 50%  of this time was spent counseling and coordinating care related to the above assessment  and plan.  Signed by: Drue Novel, NP   Please contact Palliative Medicine Team phone at [phone] for questions and concerns.  For individual provider: See Shea Evans

## 2016-11-21 NOTE — Discharge Instructions (Signed)
Follow with Primary MD  Patient, [name] Per  and other consultant's as instructed your Hospitalist MD ° °Please get a complete blood count and chemistry panel checked by your Primary MD at your next visit, and again as instructed by your Primary MD. ° °Get Medicines reviewed and adjusted: °Please take all your medications with you for your next visit with your Primary MD ° °Laboratory/radiological data: °Please request your Primary MD to go over all hospital tests and procedure/radiological results at the follow up, please ask your Primary MD to get all Hospital records sent to his/her office. ° °In some cases, they will be blood work, cultures and biopsy results pending at the time of your discharge. Please request that your primary care M.D. follows up on these results. ° °Also Note the following: °If you experience worsening of your admission symptoms, develop shortness of breath, life threatening emergency, suicidal or homicidal thoughts you must seek medical attention immediately by calling 911 or calling your MD immediately  if symptoms less severe. ° °You must read complete instructions/literature along with all the possible adverse reactions/side effects for all the Medicines you take and that have been prescribed to you. Take any new Medicines after you have completely understood and accpet all the possible adverse reactions/side effects.  ° °Do not drive when taking Pain medications or sleeping medications (Benzodaizepines) ° °Do not take more than prescribed Pain, Sleep and Anxiety Medications. It is not advisable to combine anxiety,sleep and pain medications without talking with your primary care practitioner ° °Special Instructions: If you have smoked or chewed Tobacco  in the last 2 yrs please stop smoking, stop any regular Alcohol  and or any Recreational drug use. ° °Wear Seat belts while driving. ° °Please note: °You were cared for by a hospitalist during your hospital stay. Once you are discharged,  your primary care physician will handle any further medical issues. Please note that NO REFILLS for any discharge medications will be authorized once you are discharged, as it is imperative that you return to your primary care physician (or establish a relationship with a primary care physician if you do not have one) for your post hospital discharge needs so that they can reassess your need for medications and monitor your lab values. ° ° ° ° °

## 2016-11-21 NOTE — Discharge Summary (Addendum)
Physician Discharge Summary  Cory Robinson GDJ:[id number] DOB: 17-Nov-1945 DOA: 11/19/2016  PCP: Patient, [name] Per Oncologist: Dr. Talbert Cage  Admit date: 11/19/2016 Discharge date: 11/21/2016  Admitted From: Home  Disposition: Home   Recommendations for Outpatient Follow-up:  1. Follow up with oncologist as scheduled on 11/30/16 2. Please follow up on the following pending results: CEA   Discharge Condition: STABLE  CODE STATUS: DNR  Brief Hospitalization Summary: Please see all hospital notes, images, labs for full details of the hospitalization.  HPI:    Cory Robinson  is a 71 y.o. male,with history of hypertension, hyperlipidemia came to hospital after he has been having anorexia, weight loss, nausea since June of this year.patient also complains of intermittent abdominal pain, diarrhea alternating with constipation.  He denies vomiting. Denies chest pain Complains of shortness of breath on exertion Patient was seen by GI, underwent endoscopy, and was started on dicyclomine and Protonix.  In the ED, patient was found to be in mild acute kidney injury. CT of the abdomen showed circumferential mass in the sigmoid colon consistent with colorectal carcinoma. Mass locally invasive into adjacent sigmoid colon.  Widespread hepatic metastasis involving left and right hepatic lobes  Brief Admission Hx: RoyHammockis a 71 y.o.male,with history of hypertension, hyperlipidemia came to hospital after he has been having anorexia, weight loss, nausea since June of this year.patient also complains of intermittent abdominal pain, diarrhea alternating with constipation. In the ED today, patient was found to be in mild acute kidney injury. CT of the abdomen showed circumferential mass in the sigmoid colon consistent with colorectal carcinoma. Mass locally invasive into adjacent sigmoid colon.  Widespread hepatic metastasis involving left and right hepatic lobes  MDM/Assessment & Plan:    1. Metastatic colon cancer - oncology consult obtained. His disease is not curable.  CT lungs reveals metastatic lesions.  Pt unsure if he wants to pursue palliative chemotherapy.  CEA level pending.  Pt says he wants to go home.  He will follow up with Dr. Talbert Cage on 11/30/16 as scheduled. The patient met with palliative medicine team prior to discharge.  Pt is DNR.   2. Hypokalemia - corrected with IVF replacement.  3. Hyponatremia - improved with IVF.  4. Hypoalbuminemia-Consult dietitian. Supplements ordered.   5. Anemia, likely from chronic blood loss - Following. Acute drop likely hemodilution.  6. AKI - improving with IVF hydration.  7. Diabetes mellitus, type 2 - sliding scale coverage in hospital.   DVT prophylaxis: lovenox Code Status: Full  Family Communication:  Disposition Plan: TBD   Discharge Diagnoses:  Active Problems:   AKI (acute kidney injury) ([place])   Metastatic colon cancer to liver ([place])   Anemia due to chronic blood loss   Severe protein-calorie malnutrition ([place])   Diabetes mellitus without complication [facility])  Discharge Instructions: Discharge Instructions    Call MD for:  difficulty breathing, headache or visual disturbances    Complete by:  As directed    Call MD for:  extreme fatigue    Complete by:  As directed    Call MD for:  persistant dizziness or light-headedness    Complete by:  As directed    Call MD for:  persistant nausea and vomiting    Complete by:  As directed    Call MD for:  severe uncontrolled pain    Complete by:  As directed    Increase activity slowly    Complete by:  As directed      Allergies as of 11/21/2016  No Known Allergies     Medication List    STOP taking these medications   losartan-hydrochlorothiazide 100-25 MG tablet Commonly known as:  HYZAAR     TAKE these medications   aspirin EC 81 MG tablet Take 81 mg by mouth daily.   feeding supplement (ENSURE ENLIVE) Liqd Take 237 mLs by mouth 3 (three) times  daily between meals.   gemfibrozil 600 MG tablet Commonly known as:  LOPID Take 600 mg by mouth daily.   multivitamin with minerals Tabs tablet Take 1 tablet by mouth daily.   pantoprazole 40 MG tablet Commonly known as:  PROTONIX Take 40 mg by mouth daily.   promethazine 12.5 MG tablet Commonly known as:  PHENERGAN Take 12.5 mg by mouth every 6 (six) hours as needed for nausea or vomiting.   Senna 8.6 MG Caps Take 1 capsule by mouth daily as needed.   vitamin B-12 1000 MCG tablet Commonly known as:  CYANOCOBALAMIN Take 1,000 mcg by mouth daily.      Follow-up Information    Twana First, MD Follow up on 11/30/2016.   Specialty:  Oncology Why:  at 8:30 am Contact information: [address] [place] [address] [phone]          No Known Allergies Current Discharge Medication List    START taking these medications   Details  feeding supplement, ENSURE ENLIVE, (ENSURE ENLIVE) LIQD Take 237 mLs by mouth 3 (three) times daily between meals. Qty: 4977 mL, Refills: 0    Multiple Vitamin (MULTIVITAMIN WITH MINERALS) TABS tablet Take 1 tablet by mouth daily.    Sennosides (SENNA) 8.6 MG CAPS Take 1 capsule by mouth daily as needed. Refills: 0      CONTINUE these medications which have NOT CHANGED   Details  aspirin EC 81 MG tablet Take 81 mg by mouth daily.    gemfibrozil (LOPID) 600 MG tablet Take 600 mg by mouth daily.    pantoprazole (PROTONIX) 40 MG tablet Take 40 mg by mouth daily.     promethazine (PHENERGAN) 12.5 MG tablet Take 12.5 mg by mouth every 6 (six) hours as needed for nausea or vomiting.    vitamin B-12 (CYANOCOBALAMIN) 1000 MCG tablet Take 1,000 mcg by mouth daily.      STOP taking these medications     losartan-hydrochlorothiazide (HYZAAR) 100-25 MG tablet         Procedures/Studies: Ct Chest W Contrast  Result Date: 11/20/2016 CLINICAL DATA:  Colon cancer with liver mets. EXAM: CT CHEST WITH CONTRAST TECHNIQUE:  Multidetector CT imaging of the chest was performed during intravenous contrast administration. CONTRAST:  66m ISOVUE-300 IOPAMIDOL (ISOVUE-300) INJECTION 61% COMPARISON:  None. FINDINGS: Cardiovascular: Heart size normal. Tiny pericardial effusion evident. Coronary artery calcification is evident. No thoracic aortic aneurysm. Mediastinum/Nodes: No mediastinal lymphadenopathy. There is no hilar lymphadenopathy. The esophagus has normal imaging features. There is no axillary lymphadenopathy. Lungs/Pleura: 2.2 cm left lower lobe pulmonary nodule is compatible metastatic disease. 12 mm nodule identified right lower lobe on image 95. 8 mm right lower lobe pulmonary nodule is seen on image 68. 5 mm right upper lobe nodule visible on image 27. 4 mm right upper lobe nodule seen on image 63. Dependent atelectasis noted bilaterally. Small right pleural effusion is evident. Upper Abdomen: Multiple large liver metastases. Musculoskeletal: Bone windows reveal no worrisome lytic or sclerotic osseous lesions. IMPRESSION: 1. Multiple bilateral pulmonary nodules consistent with metastatic disease. 2. Coronary artery atherosclerosis. Electronically Signed   By: EMisty Stanley  M.D.   On: 11/20/2016 16:49   Ct Abdomen Pelvis W Contrast  Result Date: 11/19/2016 CLINICAL DATA:  Loss of appetite.  30 pound weight loss over 6 weeks EXAM: CT ABDOMEN AND PELVIS WITH CONTRAST TECHNIQUE: Multidetector CT imaging of the abdomen and pelvis was performed using the standard protocol following bolus administration of intravenous contrast. CONTRAST:  44m ISOVUE-300 IOPAMIDOL (ISOVUE-300) INJECTION 61% COMPARISON:  None. FINDINGS: Lower chest: 21 mm nodule at the LEFT lung base. Similar round lobe 11 mm nodule at the RIGHT lung base. Hepatobiliary: Large round peripherally enhancing hypodense lesions in the liver consistent metastatic disease. Large lesion in the RIGHT hepatic lobe measures 10.3 cm (image 52, series 6). More inferior RIGHT  hepatic lobe lesion measures 7.3 cm (image 44, series 6). Lesions involve central LEFT hepatic lobe measuring 6.8 cm (image 18, series 3). Less involvement of the lateral LEFT hepatic lobe however there are several smaller lesions present. Pancreas: Pancreas is normal. No ductal dilatation. No pancreatic inflammation. Spleen: Normal spleen Adrenals/urinary tract: Adrenal glands are normal. LEFT kidney atrophic. RIGHT kidney normal Stomach/Bowel: Stomach, duodenum, small-bowel are normal. Appendix not identified. Ascending and transverse colon are normal. There is a mass lesion in the proximal sigmoid colon measuring 6 cm in length and 4.2 cm in width (image 43, series 6). This circumferential bowel wall thickening is consistent malignancy. No evidence of bowel obstruction proximal to this circumferential mass. The more distal sigmoid colon rectum normal. The rectal mass extends into the mesenteric border of the mesocolon (image 47, series 7) Vascular/Lymphatic: Abdominal aortic normal caliber. Periaortic lymph node LEFT aorta measures 9 mm short axis. Small lymph nodes in the mesocolon are suspicious (image 46, series 9) Reproductive:  prostate is enlarged. Other: No peritoneal metastasis identified. Musculoskeletal: Multiple levels of endplate spurring and joint space narrowing. Endplate lesions are favors Schmorl's nodes. Note clear aggressive osseous lesion. IMPRESSION: 1. Circumferential mass in the sigmoid colon consistent with colorectal carcinoma. Mass locally invasive into the adjacent sigmoid mesocolon. No evidence of colon bowel obstruction at this time. 2. Widespread hepatic metastasis involving the LEFT and RIGHT hepatic lobes. 3. Rounded nodules at the LEFT and RIGHT lung base consistent with metastatic colorectal carcinoma. Findings conveyed toJoe Zammitt on 11/19/2016  at16:04. Electronically Signed   By: SSuzy BouchardM.D.   On: 11/19/2016 16:05     Subjective: Pt says that he wants to go  home.  He doesn't want to wait around in hospital any longer.  He says he will think about palliative chemotherapy and follow up with Dr. ZTalbert Cageon 11/30/16 as scheduled.    Discharge Exam: Vitals:   11/20/16 2015 11/21/16 0647  BP: 110/61 (!) 100/59  Pulse: 64 61  Resp: 16 16  Temp: 98.7 F (37.1 C) 98 F (36.7 C)  SpO2: 99% 99%   Vitals:   11/20/16 0800 11/20/16 1445 11/20/16 2015 11/21/16 0647  BP: (!) 100/58 (!) 103/54 110/61 (!) 100/59  Pulse: 80 67 64 61  Resp: _0 Temp: 98.8 F (37.1 C) 99.3 F (37.4 C) 98.7 F (37.1 C) 98 F (36.7 C)  TempSrc: Oral Oral Oral Oral  SpO2: 98% 98% 99% 99%  Weight:      Height:        General exam: awake, alert, chronically ill appearing and emaciated appearing.   Respiratory system: Clear. No increased work of breathing. Cardiovascular system: S1 & S2 heard, RRR. No JVD, murmurs, gallops, clicks or pedal edema. Gastrointestinal system: Abdomen  is nondistended, soft. Normal bowel sounds heard. Central nervous system: Alert and oriented. No focal neurological deficits. Extremities: no CCE.   The results of significant diagnostics from this hospitalization (including imaging, microbiology, ancillary and laboratory) are listed below for reference.     Microbiology: No results found for this or any previous visit (from the past 240 hour(s)).   Labs: BNP (last 3 results) No results for input(s): BNP in the last 8760 hours. Basic Metabolic Panel:  Recent Labs Lab 11/19/16 1408 11/20/16 0618 11/21/16 0409  NA 131* 135 135  K 3.4* 3.7 3.8  CL 93* 99* 102  CO2 _0 GLUCOSE 226* 144* 159*  BUN 43* 38* 28*  CREATININE 1.46* 1.29* 1.07  CALCIUM 9.1 8.5* 8.0*  MG 1.1*  --   --    Liver Function Tests:  Recent Labs Lab 11/19/16 1408 11/20/16 0618 11/21/16 0409  AST 47* 45* 44*  ALT _1 ALKPHOS 297* 283* 266*  BILITOT 2.3* 1.4* 1.1  PROT 6.9 6.0* 5.5*  ALBUMIN 2.6* 2.2* 2.0*    Recent Labs Lab  11/19/16 1408  LIPASE 30   No results for input(s): AMMONIA in the last 168 hours. CBC:  Recent Labs Lab 11/19/16 1408 11/20/16 0618 11/21/16 0409  WBC 11.2* 8.5 7.8  NEUTROABS 8.7*  --   --   HGB 11.6* 9.9* 10.0*  HCT 34.6* 30.3* 30.7*  MCV 88.7 90.2 90.3  PLT 334 285 282   Cardiac Enzymes: No results for input(s): CKTOTAL, CKMB, CKMBINDEX, TROPONINI in the last 168 hours. BNP: Invalid input(s): POCBNP CBG:  Recent Labs Lab 11/20/16 0800 11/20/16 1153 11/20/16 1648 11/20/16 2056 11/21/16 0735  GLUCAP 160* 235* 120* 232* 164*   D-Dimer No results for input(s): DDIMER in the last 72 hours. Hgb A1c  Recent Labs  11/19/16 1408  HGBA1C 7.5*   Lipid Profile No results for input(s): CHOL, HDL, LDLCALC, TRIG, CHOLHDL, LDLDIRECT in the last 72 hours. Thyroid function studies No results for input(s): TSH, T4TOTAL, T3FREE, THYROIDAB in the last 72 hours.  Invalid input(s): FREET3 Anemia work up No results for input(s): VITAMINB12, FOLATE, FERRITIN, TIBC, IRON, RETICCTPCT in the last 72 hours. Urinalysis    Component Value Date/Time   COLORURINE YELLOW 11/19/2016 1410   APPEARANCEUR CLEAR 11/19/2016 1410   LABSPEC 1.013 11/19/2016 1410   PHURINE 5.0 11/19/2016 1410   GLUCOSEU NEGATIVE 11/19/2016 1410   HGBUR NEGATIVE 11/19/2016 [place] 11/19/2016 1410   [place] 11/19/2016 1410   PROTEINUR NEGATIVE 11/19/2016 1410   NITRITE NEGATIVE 11/19/2016 1410   LEUKOCYTESUR NEGATIVE 11/19/2016 1410   Sepsis Labs Invalid input(s): PROCALCITONIN,  WBC,  LACTICIDVEN Microbiology No results found for this or any previous visit (from the past 240 hour(s)).  Time coordinating discharge:   SIGNED:  Irwin Brakeman, MD  [facility] 11/21/2016, 11:08 AM Pager [phone]  If 7PM-7AM, please contact night-coverage [email or web address] Password [email or web address]

## 2016-11-22 LAB — CEA: CEA1: 203.7 ng/mL — AB (ref 0.0–4.7)

## 2016-11-29 NOTE — Progress Notes (Signed)
[address] [place], [place] [address]   CLINIC:  Medical Oncology/Hematology  REASON FOR CONSULTATION: Newly diagnosed metastatic colon cancer    REFERRAL FROM:  [facility] team; pt with recent hospitalization  PRIMARY CARE PROVIDER: Patient, [name] Per      HISTORY OF PRESENT ILLNESS:  Cory Robinson 71 y.o. male presents for initial consultation for newly diagnosed metastatic colon cancer.   Initially presented to [facility] ED on 11/19/16 with generalized weakness, lack of appetite, and 30-lb weight loss over past 6 weeks.  CT abdomen/pelvis on 11/19/16 revealed circumferential mass in the sigmoid colon measuring ~6 cm; mass locally invasive into the adjacent sigmoid mesocolon without evidence of bowel obstruction; also found to have widespread hepatic metastases involving both hepatic lobes, as well as nodules in bilateral lungs consistent with metastatic colorectal cancer.  A dedicated CT chest performed on 11/20/16 demonstrated multiple bilateral pulmonary nodules consistent with metastatic disease.  He was admitted to [facility] for further evaluation.  CEA elevated at 203.7 on 11/19/16.   Oncology consult by Dr. Talbert Cage on 11/20/16 recommended outpatient follow-up for consideration for palliative chemotherapy; Hospice was also discussed with patient as potential option at that time as well.    Patient noted to be DNR during inpatient stay. He was discharged on 11/21/16 to home with oncology outpatient appointment scheduled.      INTERVAL HISTORY:  Cory Robinson 71 y.o. male presents for initial outpatient consultation for newly diagnosed metastatic colon cancer.    Here today with his stepson.    Since he was discharged from the hospital, he tells me he has been feeling "a little better."  He is continuing to struggle with decreased appetite (aobut 50%) and early satiety; supplementing his diet with Ensure 2 cans/day. He feels  very tired/fatigued; energy levels 50%.  Reports persistent diarrhea, which is worse at night. He wakes up every 2 hours at night "and it's just watery stools." The diarrhea affects his ability to sleep well and he has to plan the next morning/day around his possible bowel movements.  His legs are swollen and itch sometimes; he has some numbness/burning to his toes at times. Reports easy bruising.  He endorses abdominal pain, which is constant and worse to RLQ of his abd.  He becomes short of breath with exertion.  Reports that he does not have any medications for abdominal pain.  He has occasional nausea "and I think I have a few of those pills left they gave me when I got out of the hospital."    His stepson has living will and healthcare POA paperwork; they have questions about this paperwork.  I will touch base with our nursing staff about how best to get this paperwork notarized for patient since our social worker is not here at [facility] any longer who historically would help with completing these forms.    He and his family have talked about whether or not to pursue treatment for his cancer.  He remains undecided and wants to learn more info today.      REVIEW OF SYSTEMS: Review of Systems  Constitutional: Positive for chills ("every evening I get the chills" ) and fatigue. Negative for fever.  HENT:  Negative.   Eyes: Negative.   Respiratory: Positive for shortness of breath.   Cardiovascular: Positive for leg swelling.  Gastrointestinal: Positive for abdominal pain, diarrhea and nausea.  Endocrine: Negative.   Genitourinary: Negative.  Musculoskeletal: Negative.   Skin: Positive for itching.  Neurological: Positive for numbness.  Hematological: Bruises/bleeds easily.  Psychiatric/Behavioral: Negative.      PAST MEDICAL & SURGICAL HISTORY:  Past Medical History:  Diagnosis Date  . Diabetes mellitus without complication ([place])   . Duodenitis   . Esophagitis   . Hiatal hernia     Past Surgical History:  Procedure Laterality Date  . CHOLECYSTECTOMY    . EYE SURGERY       SOCIAL HISTORY:  Social History   Social History  . Marital status: Divorced    Spouse name: N/A  . Number of children: N/A  . Years of education: N/A   Social History Main Topics  . Smoking status: Current Every Day Smoker    Packs/day: 1.00    Types: Cigarettes  . Smokeless tobacco: Never Used  . Alcohol use Yes     Comment: daily until 3 months ago  . Drug use: No  . Sexual activity: Not Asked   Other Topics Concern  . None   Social History Narrative  . None     CURRENT MEDICATIONS:  Current Outpatient Prescriptions on File Prior to Visit  Medication Sig Dispense Refill  . aspirin EC 81 MG tablet Take 81 mg by mouth daily.    Marland Kitchen gemfibrozil (LOPID) 600 MG tablet Take 600 mg by mouth daily.    . Multiple Vitamin (MULTIVITAMIN WITH MINERALS) TABS tablet Take 1 tablet by mouth daily.    . pantoprazole (PROTONIX) 40 MG tablet Take 40 mg by mouth daily.     . promethazine (PHENERGAN) 12.5 MG tablet Take 12.5 mg by mouth every 6 (six) hours as needed for nausea or vomiting.    . Sennosides (SENNA) 8.6 MG CAPS Take 1 capsule by mouth daily as needed.  0  . vitamin B-12 (CYANOCOBALAMIN) 1000 MCG tablet Take 1,000 mcg by mouth daily.     No current facility-administered medications on file prior to visit.      ALLERGIES: No Known Allergies   PERFORMANCE STATUS: 1-2 - Symptomatic; requires occasional assistance    PHYSICAL EXAM:  Vitals:   11/30/16 0902  BP: (!) 119/59  Pulse: 84  Resp: 20  Temp: 98.1 F (36.7 C)  SpO2: 100%   Filed Weights   11/30/16 0902  Weight: 154 lb 6.4 oz (70 kg)    Physical Exam  Constitutional: He is oriented to person, place, and time.  Thin male in no acute distress  -Exam done with patient seated in chair   HENT:  Head: Normocephalic.  Mouth/Throat: Oropharynx is clear and moist. No oropharyngeal exudate.  Eyes: Pupils are  equal, round, and reactive to light. Conjunctivae are normal. No scleral icterus.  Neck: Normal range of motion. Neck supple.  Cardiovascular: Normal rate and regular rhythm.   Pulmonary/Chest:  Diminished breath sounds throughout  Abdominal: Soft. Bowel sounds are normal. There is tenderness.  Musculoskeletal: He exhibits edema (1-2+ BLE pitting edema).  Lymphadenopathy:    He has no cervical adenopathy.       Right: No supraclavicular adenopathy present.       Left: No supraclavicular adenopathy present.  Neurological: He is alert and oriented to person, place, and time. No cranial nerve deficit.  Skin: Skin is warm and dry.  Psychiatric: Mood, memory, affect and judgment normal.  Nursing note and vitals reviewed.    LABORATORY DATA: CBC    Component Value Date/Time   WBC 7.8 11/21/2016 0409   RBC 3.40 (L)  11/21/2016 0409   HGB 10.0 (L) 11/21/2016 0409   HCT 30.7 (L) 11/21/2016 0409   PLT 282 11/21/2016 0409   MCV 90.3 11/21/2016 0409   MCH 29.4 11/21/2016 0409   MCHC 32.6 11/21/2016 0409   RDW 13.1 11/21/2016 0409   LYMPHSABS 1.2 11/19/2016 1408   MONOABS 1.2 (H) 11/19/2016 1408   EOSABS 0.1 11/19/2016 1408   BASOSABS 0.0 11/19/2016 1408   CMP Latest Ref Rng & Units 11/21/2016 11/20/2016 11/19/2016  Glucose 65 - 99 mg/dL 159(H) 144(H) 226(H)  BUN 6 - 20 mg/dL 28(H) 38(H) 43(H)  Creatinine 0.61 - 1.24 mg/dL 1.07 1.29(H) 1.46(H)  Sodium 135 - 145 mmol/L 135 135 131(L)  Potassium 3.5 - 5.1 mmol/L 3.8 3.7 3.4(L)  Chloride 101 - 111 mmol/L 102 99(L) 93(L)  CO2 22 - 32 mmol/L 22 24 22   Calcium 8.9 - 10.3 mg/dL 8.0(L) 8.5(L) 9.1  Total Protein 6.5 - 8.1 g/dL 5.5(L) 6.0(L) 6.9  Total Bilirubin 0.3 - 1.2 mg/dL 1.1 1.4(H) 2.3(H)  Alkaline Phos 38 - 126 U/L 266(H) 283(H) 297(H)  AST 15 - 41 U/L 44(H) 45(H) 47(H)  ALT 17 - 63 U/L 20 22 25       DIAGNOSTIC IMAGING:  *I have reviewed the below radiologic images and agree with reports as listed below.  CT abd/pelvis:  11/19/16 CLINICAL DATA:  Loss of appetite.  30 pound weight loss over 6 weeks  EXAM: CT ABDOMEN AND PELVIS WITH CONTRAST  TECHNIQUE: Multidetector CT imaging of the abdomen and pelvis was performed using the standard protocol following bolus administration of intravenous contrast.  CONTRAST:  20mL ISOVUE-300 IOPAMIDOL (ISOVUE-300) INJECTION 61%  COMPARISON:  None.  FINDINGS: Lower chest: 21 mm nodule at the LEFT lung base. Similar round lobe 11 mm nodule at the RIGHT lung base.  Hepatobiliary: Large round peripherally enhancing hypodense lesions in the liver consistent metastatic disease. Large lesion in the RIGHT hepatic lobe measures 10.3 cm (image 52, series 6). More inferior RIGHT hepatic lobe lesion measures 7.3 cm (image 44, series 6). Lesions involve central LEFT hepatic lobe measuring 6.8 cm (image 18, series 3). Less involvement of the lateral LEFT hepatic lobe however there are several smaller lesions present.  Pancreas: Pancreas is normal. No ductal dilatation. No pancreatic inflammation.  Spleen: Normal spleen  Adrenals/urinary tract: Adrenal glands are normal. LEFT kidney atrophic. RIGHT kidney normal  Stomach/Bowel: Stomach, duodenum, small-bowel are normal. Appendix not identified. Ascending and transverse colon are normal.  There is a mass lesion in the proximal sigmoid colon measuring 6 cm in length and 4.2 cm in width (image 43, series 6). This circumferential bowel wall thickening is consistent malignancy. No evidence of bowel obstruction proximal to this circumferential mass. The more distal sigmoid colon rectum normal.  The rectal mass extends into the mesenteric border of the mesocolon (image 47, series 7)  Vascular/Lymphatic: Abdominal aortic normal caliber. Periaortic lymph node LEFT aorta measures 9 mm short axis. Small lymph nodes in the mesocolon are suspicious (image 46, series 9)  Reproductive:  prostate is  enlarged.  Other: No peritoneal metastasis identified.  Musculoskeletal: Multiple levels of endplate spurring and joint space narrowing. Endplate lesions are favors Schmorl's nodes. Note clear aggressive osseous lesion.  IMPRESSION: 1. Circumferential mass in the sigmoid colon consistent with colorectal carcinoma. Mass locally invasive into the adjacent sigmoid mesocolon. No evidence of colon bowel obstruction at this time. 2. Widespread hepatic metastasis involving the LEFT and RIGHT hepatic lobes. 3. Rounded nodules at the LEFT and RIGHT  lung base consistent with metastatic colorectal carcinoma. Findings conveyed toJoe Robinson on 11/19/2016  at16:04.   Electronically Signed   By: Suzy Bouchard M.D.   On: 11/19/2016 16:05   CT chest: 11/20/16 CLINICAL DATA:  Colon cancer with liver mets.  EXAM: CT CHEST WITH CONTRAST  TECHNIQUE: Multidetector CT imaging of the chest was performed during intravenous contrast administration.  CONTRAST:  53mL ISOVUE-300 IOPAMIDOL (ISOVUE-300) INJECTION 61%  COMPARISON:  None.  FINDINGS: Cardiovascular: Heart size normal. Tiny pericardial effusion evident. Coronary artery calcification is evident. No thoracic aortic aneurysm.  Mediastinum/Nodes: No mediastinal lymphadenopathy. There is no hilar lymphadenopathy. The esophagus has normal imaging features. There is no axillary lymphadenopathy.  Lungs/Pleura: 2.2 cm left lower lobe pulmonary nodule is compatible metastatic disease. 12 mm nodule identified right lower lobe on image 95. 8 mm right lower lobe pulmonary nodule is seen on image 68. 5 mm right upper lobe nodule visible on image 27. 4 mm right upper lobe nodule seen on image 63. Dependent atelectasis noted bilaterally. Small right pleural effusion is evident.  Upper Abdomen: Multiple large liver metastases.  Musculoskeletal: Bone windows reveal no worrisome lytic or sclerotic osseous  lesions.  IMPRESSION: 1. Multiple bilateral pulmonary nodules consistent with metastatic disease. 2. Coronary artery atherosclerosis.   Electronically Signed   By: Misty Stanley M.D.   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  CT imaging revealed evidence of sigmoid colon mass with widespread metastatic lesions to bilateral liver lobes and bilateral lungs. Baseline CEA elevated at 203.7 on 11/19/16.  -Spent some time reviewing recent CT imaging and lab results with patient/family today.  He is able to articulate that his disease is not curable; he knows there is evidence of cancer in his colon that has likely spread to his liver and lung.  -Discussed option of pursuing palliative chemo with FOLFOX with Avastin.  Briefly reviewed chemo schedule (every 2 weeks) and possible side effects of chemo.  Initially he was reluctant to pursue chemo; "That just seems like a lot of trouble."  After additional discussion, patient elected to proceed with palliative chemotherapy.   -Orders placed for image-guided liver biopsy at [facility] to confirm metastatic disease; will also request that pathology sends specimen for [facility] testing too, since this info may help guide additional treatment options (namely immunotherapy) for the patient in the future.   -Orders placed for port-a-cath placement; will try to get port placed at [facility] on same day as liver biopsy, if possible.   -Plan to start FOLFOX/Avastin chemotherapy 3-4 days after port is placed.  He will get chemo teaching by nursing.  Nurse navigator made aware of new patient and treatment plans.   -Return to cancer center ~1 week after cycle #1 chemo for toxicity check and follow-up visit with port flush/labs.    Diarrhea:   -Likely secondary to malignancy.  -Patient education provided about diarrhea, which will be helpful for him as he starts chemotherapy since 5-FU can cause diarrhea.  Encouraged him to take Imodium 2 caps with 1st diarrhea, then 1 cap with each stool thereafter.  Also provided him a prescription for Lomotil that he can [place] PRN as well.   -Encouraged him to drink plenty of fluids d/t possible GI fluid loss and increased  risk for dehydration.   Abdominal pain:  -Secondary to malignancy.  -Provided prescription of Oxycodone 5 mg Q4Hprn, #60 today. Advised him to be aware that opiates can cause constipation and he should monitor his bowel movements with goal of having BM every day or every other day.  Opiates may help slow his current diarrhea a bit as well.   -Will try to avoid Tylenol-containing products in the future given his multiple liver metastases and mildly elevated AST on recent lab work.   LE edema:  -Likely secondary to liver mets and hypoalbuminemia.  Encouraged him to increase protein and caloric intake as tolerated.  -Will not give any diuretics at this time given his increased risk of dehydration.   Goals of care:  -Mr. Cory Robinson understands that his cancer is not curable, but is treatable.  Noted that while he was in the hospital, his code status was DNR.  Discussed code status with him should there be an emergency in the outpatient setting. He confirms his wishes for no CPR or intubation in the event of emergency; outpatient DNR orders signed today.  -We discussed that there will come a time when either side effects of treatment may become too severe and/or his disease progresses through recommended treatment options. At that time, we would recommend hospice/palliative care. We will guide him along the way as he pursues treatment with interval imaging and follow-up appts to evaluate his response to treatment.  -He would like his stepson to be his Healthcare POA; they are working on the  paperwork and will need assistance with getting this paperwork notarized soon.      Dispo:  -Maintain outpatient DNR/DNI; outpatient orders signed today.  -Liver biopsy and port-a-cath placement at [facility] as soon as able.   -Chemo teaching with nursing.  -Start FOLFOX/Avastin chemotherapy 3-4 days after port placement.  -Return to cancer center 1 week post cycle #1 chemo for toxicity check and follow-up with port flush/labs.    -This patient was seen & evaluated by Dr. Twana First, who formulated the above plan of care. This was a shared visit consultation.  Please see her attestation documentation for additional details.    A total of 60 minutes was spent in face-to-face care of this patient, with greater than 50% of that time spent in counseling and care coordination.    Orders placed this encounter:  Orders Placed This Encounter  Procedures  . IR FLUORO GUIDE PORT INSERTION LEFT  . CT Biopsy  . US BIOPSY (LIVER)  . DNR (Do Not Resuscitate)      Mike Craze, NP [place] [phone]

## 2016-11-30 ENCOUNTER — Encounter (HOSPITAL_COMMUNITY): Payer: Self-pay | Admitting: Adult Health

## 2016-11-30 ENCOUNTER — Encounter (HOSPITAL_COMMUNITY): Payer: Medicare PPO | Attending: Adult Health | Admitting: Adult Health

## 2016-11-30 VITALS — BP 119/59 | HR 84 | Temp 98.1°F | Resp 20 | Ht 67.0 in | Wt 154.4 lb

## 2016-11-30 DIAGNOSIS — R600 Localized edema: Secondary | ICD-10-CM

## 2016-11-30 DIAGNOSIS — R197 Diarrhea, unspecified: Secondary | ICD-10-CM | POA: Diagnosis not present

## 2016-11-30 DIAGNOSIS — R112 Nausea with vomiting, unspecified: Secondary | ICD-10-CM | POA: Diagnosis not present

## 2016-11-30 DIAGNOSIS — C7800 Secondary malignant neoplasm of unspecified lung: Secondary | ICD-10-CM

## 2016-11-30 DIAGNOSIS — G893 Neoplasm related pain (acute) (chronic): Secondary | ICD-10-CM | POA: Diagnosis not present

## 2016-11-30 DIAGNOSIS — C189 Malignant neoplasm of colon, unspecified: Secondary | ICD-10-CM

## 2016-11-30 DIAGNOSIS — C787 Secondary malignant neoplasm of liver and intrahepatic bile duct: Secondary | ICD-10-CM | POA: Diagnosis not present

## 2016-11-30 MED ORDER — ONDANSETRON HCL 8 MG PO TABS: 8.0000 mg | ORAL_TABLET | Freq: Three times a day (TID) | ORAL | 0 refills | Status: DC | PRN

## 2016-11-30 MED ORDER — OXYCODONE HCL 5 MG PO TABS: 5.0000 mg | ORAL_TABLET | ORAL | 0 refills | Status: DC | PRN

## 2016-11-30 MED ORDER — DIPHENOXYLATE-ATROPINE 2.5-0.025 MG PO TABS: 1.0000 | ORAL_TABLET | Freq: Four times a day (QID) | ORAL | 0 refills | Status: AC | PRN

## 2016-11-30 NOTE — Patient Instructions (Addendum)
[place] at [facility] Discharge Instructions  RECOMMENDATIONS MADE BY THE CONSULTANT AND ANY TEST RESULTS WILL BE SENT TO YOUR REFERRING PHYSICIAN.  You were seen today by Mike Craze NP. Liver biopsy and port placement at [facility] to be scheduled. Chemo teaching with Diane to be scheduled. Start Chemo a few days after port placement. Return a week after first chemo for labs and follow up.   Thank you for choosing [place] at [facility] to provide your oncology and hematology care.  To afford each patient quality time with our provider, please arrive at least 15 minutes before your scheduled appointment time.    If you have a lab appointment with the [facility] please come in thru the  Main Entrance and check in at the main information desk  You need to re-schedule your appointment should you arrive 10 or more minutes late.  We strive to give you quality time with our providers, and arriving late affects you and other patients whose appointments are after yours.  Also, if you no show three or more times for appointments you may be dismissed from the clinic at the providers discretion.     Again, thank you for choosing [facility].  Our hope is that these requests will decrease the amount of time that you wait before being seen by our physicians.       _____________________________________________________________  Should you have questions after your visit to [facility], please contact our office at [phone] between the hours of 8:30 a.m. and 4:30 p.m.  Voicemails left after 4:30 p.m. will not be returned until the following business day.  For prescription refill requests, have your pharmacy contact our office.       Resources For Cancer Patients and their Caregivers ? American Cancer Society: Can assist with transportation, wigs, general needs, runs Look Good Feel Better.         820 628 1197 ? Cancer Care: Provides financial assistance, online support groups, medication/co-pay assistance.  1-800-813-HOPE 331-118-4393) ? [facility] Assists [place] cancer patients and their families through emotional , educational and financial support.  (415)823-6572 ? [place] DSS Where to apply for food stamps, Medicaid and utility assistance. (617) 791-6077 ? [facility]: Transportation to medical appointments. (626) 019-5690 ? Social Security Administration: May apply for disability if have a Stage IV cancer. 9316717334 (305) 510-0059 ? [employer], Disability and Transit Services: Assists with nutrition, care and transit needs. [place] Support Programs: @10RELATIVEDAYS @ > Cancer Support Group  2nd Tuesday of the month 1pm-2pm, Journey Room  > Creative Journey  3rd Tuesday of the month 1130am-1pm, Journey Room  > Look Good Feel Better  1st Wednesday of the month 10am-12 noon, Journey Room (Call Otho to register [phone])

## 2016-12-04 ENCOUNTER — Other Ambulatory Visit (HOSPITAL_COMMUNITY): Payer: Self-pay | Admitting: *Deleted

## 2016-12-04 ENCOUNTER — Other Ambulatory Visit (HOSPITAL_COMMUNITY): Payer: Self-pay | Admitting: Oncology

## 2016-12-04 DIAGNOSIS — C189 Malignant neoplasm of colon, unspecified: Secondary | ICD-10-CM

## 2016-12-04 DIAGNOSIS — C787 Secondary malignant neoplasm of liver and intrahepatic bile duct: Principal | ICD-10-CM

## 2016-12-04 MED ORDER — LIDOCAINE-PRILOCAINE 2.5-2.5 % EX CREA: TOPICAL_CREAM | CUTANEOUS | 3 refills | Status: DC

## 2016-12-04 MED ORDER — DEXAMETHASONE 4 MG PO TABS: 8.0000 mg | ORAL_TABLET | Freq: Every day | ORAL | 1 refills | Status: DC

## 2016-12-04 MED ORDER — PROCHLORPERAZINE MALEATE 10 MG PO TABS: 10.0000 mg | ORAL_TABLET | Freq: Four times a day (QID) | ORAL | 1 refills | Status: DC | PRN

## 2016-12-04 MED ORDER — ONDANSETRON HCL 8 MG PO TABS: 8.0000 mg | ORAL_TABLET | Freq: Two times a day (BID) | ORAL | 1 refills | Status: DC | PRN

## 2016-12-04 NOTE — Patient Instructions (Signed)
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  You will be treated with palliative intent, which means that your cancer is treatable but not curable.  We are going to treat you with the regimen FOLFOX and Avastin.  This will be given every 2 weeks or 14 days.  Each 14 days is one cycle.  You will have a pump that you take home for 48 hours.  You will return and we will remove the pump and de-access your port at that time.   You will see the doctor regularly throughout treatment.  We monitor your lab work prior to every treatment.  The doctor monitors your response to treatment by the way you are feeling, your blood work, and scans periodically.  There will be wait times during your visit.  It will take your lab work about 30 minutes to 1 hour to result.  Then there will be additional wait times while pharmacy mixes your medications.     Pre-medications prior to treatment: Aloxi 0.25 mg - high powered nausea/vomiting prevention medication  Dexamethasone 4 mg - steroid - given to reduce the risk of you having an allergic type reaction to the chemotherapy. It can cause you to feel energized, nervous/anxious/jittery, make you have trouble sleeping, and/or make you feel hot/flushed in the face/neck and/or look pink/red in the face/neck. These side effects will pass as the medication wears off.   5-Fluorouracil (Adrucil)  About This Drug Fluorouracil is used to treat cancer. It is given in the vein (IV).  Possible Side Effects . Hair loss. Hair loss is often temporary, although with certain medicine, hair loss can sometimes be permanent. Hair loss may happen suddenly or gradually. If you lose hair, you may lose it from your head, face, armpits, pubic area, chest, and/or legs. You may also notice your hair getting thin. . Changes in your nail color, nail loss and/or brittle nail . Darkening of the skin, or changes to the color of  your skin and/or veins used for infusion . Rash, itching . Nausea and throwing up (vomiting) . Loose bowel movements (diarrhea) . Ulcers - sores that may cause pain or bleeding in your digestive tract, which includes your mouth, esophagus, stomach, small/large intestines and rectum . Soreness of the mouth and throat. You may have red areas, white patches, or sores that hurt. . Decreased appetite (decreased hunger) . Changes in the tissue of the heart and/or heart attack. Some changes may happen that can cause your heart to have less ability to pump blood. . Bone marrow depression. This is a decrease in the number of white blood cells, red blood cells, and platelets. This may raise your risk of infection, make you tired and weak (fatigue), and raise your risk of bleeding . Sensitivity to light (photosensitivity). Photosensitivity means that you may become more sensitive to the sun and/or light. Your eyes may water more, mostly in bright light. . Allergic reaction: Allergic reactions, including anaphylaxis are rare but may happen in some patients. Signs of allergic reaction to this drug may be swelling of the face, feeling like your tongue or throat are swelling, trouble breathing, rash, itching, fever, chills, feeling dizzy, and/or feeling that your heart is beating in a fast or not normal way. If this happens, do not take another dose of this drug. You should get urgent medical treatment. . Blurred vision or other changes in eyesight Note: Not all possible side effects  are included above.  Warnings and Precautions . Hand-and-foot syndrome. The palms of your hands or soles of your feet may tingle, become numb, painful, swollen, or red. . Changes in your central nervous system can happen. The central nervous system is made up of your brain and spinal cord. You could feel extreme tiredness, agitation, confusion, hallucinations (see or hear things that are not there), trouble understanding or  speaking, loss of control of your bowels or bladder, eyesight changes, numbness or lack of strength to your arms, legs, face, or body, and coma. If you start to have any of these symptoms let your doctor know right away. . Side effects of this drug may be unexpectedly severe in some patients Note: Some of the side effects above are very rare. If you have concerns and/or questions, please discuss them with your medical team.  Important Information . This drug may be present in the saliva, tears, sweat, urine, stool, vomit, semen, and vaginal secretions. Talk to your doctor and/or your nurse about the necessary precautions to take during this time.  Treating Side Effects . To help with hair loss, wash with a mild shampoo and avoid washing your hair every day. . Avoid rubbing your scalp, pat your hair or scalp dry. . Avoid coloring your hair. . Limit your use of hair spray, electric curlers, blow dryers, and curling irons. . If you are interested in getting a wig, talk to your nurse. You can also call the [facility] at 800-ACS-2345 to find out information about the "Look Good, Feel Better" program close to where you live. It is a free program where women getting chemotherapy can learn about wigs, turbans and scarves as well as makeup techniques and skin and nail care. Marland Kitchen Keeping your nails moisturized may help with brittleness. . To help with itching, moisturize your skin several times day. . Avoid sun exposure and apply sunscreen routinely when outdoors. . If you get a rash do not put anything on it unless your doctor or nurse says you may. Keep the area around the rash clean and dry. Ask your doctor for medicine if your rash bothers you. . To help with decreased appetite, eat small, frequent meals. . Eat high caloric food such as pudding, ice cream, yogurt and milkshakes. . Drink plenty of fluids (a minimum of eight glasses per day is recommended). . If you throw up or have loose  bowel movements, you should drink more fluids so that you do not become dehydrated (lack water in the body from losing too much fluid). . To help with nausea and vomiting, eat small, frequent meals instead of three large meals a day. Choose foods and drinks that are at room temperature. Ask your nurse or doctor about other helpful tips and medicine that is available to help or stop lessen these symptoms. . If you get diarrhea, eat low-fiber foods that are high in protein and calories and avoid foods that can irritate your digestive tracts or lead to cramping. . Ask your nurse or doctor about medicine that can lessen or stop your diarrhea. . Mouth care is very important. Your mouth care should consist of routine, gentle cleaning of your teeth or dentures and rinsing your mouth with a mixture of 1/2 teaspoon of salt in 8 ounces of water or  teaspoon of baking soda in 8 ounces of water. This should be done at least after each meal and at bedtime. . If you have mouth sores, avoid mouthwash that has  alcohol. Also avoid alcohol and smoking because they can bother your mouth and throat. . Manage tiredness by pacing your activities for the day. . Be sure to include periods of rest between energy-draining activities. . To help decrease your risk of infections, wash your hands regularly. . Avoid close contact with people who have a cold, the flu, or other infections. . Use a soft toothbrush. Check with your nurse before using dental floss. . Be very careful when using knives or tools. . Use an electric shaver instead of a razor.  Food and Drug Interactions . There are no known interactions of fluorouracil with food. . Check with your doctor or pharmacist about all other prescription medicines and dietary supplements you are taking before starting this medicine as there are a lot of known drug interactions with fluorouracil. Also, check with your doctor or pharmacist before starting any new prescription or  over-the-counter medicines, or dietary supplement to make sure that there are no interactions.  When to Call the Doctor Call your doctor or nurse if you have any of these symptoms and/or any new or unusual symptoms: . Fever of 100.5 F (38 C) or higher . Chills . Easy bleeding or bruising . Trouble breathing . Feeling dizzy or lightheaded . Feeling that your heart is beating in a fast or not normal way (palpitations) . Chest pain or symptoms of a heart attack. Most heart attacks involve pain in the center of the chest that lasts more than a few minutes. The pain may go away and come back or it can be constant. It can feel like pressure, squeezing, fullness, or pain. Sometimes pain is felt in one or both arms, the back, neck, jaw, or stomach. If any of these symptoms last 2 minutes, call 911. Marland Kitchen Confusion and/or agitation . Hallucinations . Trouble understanding or speaking . Blurry vision or changes in your eyesight . Numbness or lack of strength to your arms, legs, face, or body . Nausea that stops you from eating or drinking and/or is not relieved by prescribed medicines . Throwing up more than 3 times a day . Loose bowel movements (diarrhea) 4 times a day or loose bowel movements with lack of strength or a feeling of being dizzy . Lasting loss of appetite or rapid weight loss of five pounds in a week . Pain in your mouth or throat that makes it hard to eat or drink . Pain along the digestive tract - especially if worse after eating . Blood in your vomit (bright red or coffee-ground) and/or stools (bright red, or black/tarry) . Coughing up blood . Fatigue that interferes with your daily activities . Painful, red, or swollen areas on your hands or feet . Numbness and/or tingling of your hands and/or feet . Signs of allergic reaction: swelling of the face, feeling like your tongue or throat are swelling, trouble breathing, rash, itching, fever, chills, feeling dizzy, and/or feeling that your  heart is beating in a fast or not normal way . If you think you are pregnant or may have impregnated your partner  Reproduction Warnings . Pregnancy warning: This drug may have harmful effects on the unborn baby. Women of child bearing potential should use effective methods of birth control during your cancer treatment. Let your doctor know right away if you think you may be pregnant. . Breastfeeding warning: It is not known if this drug passes into breast milk. For this reason, women should talk to their doctor about the risks and benefits  of breast feeding during treatment with this drug because this drug may enter the breast milk and cause harm to a breast feeding baby. . Fertility warning: In men and women both, this drug may affect your ability to have children in the future. Talk with your doctor or nurse if you plan to have children. Ask for information on sperm or egg banking.   Bevacizumab (Avastin)  About This Drug Bevacizumab is used to treat cancer. It is given in the vein (IV).  Possible Side Effects . Nosebleed . Headache . Teary eyes . Runny/stuffy nose . Changes in the way food and drinks taste . Bleeding in your rectum . Protein in your urine, which can affect how your kidneys work . Dry skin . A red skin rash which can be peeling or scaling . Back pain . High Blood Pressure Note: Each of the side effects above was reported in 10% or greater of patients treated with bevacizumab. Not all possible side effects are included above.  Warnings and Precautions . Perforation or fistula- an abnormal hole in your stomach, intestine, esophagus, or other organ, which may be life-threatening . Slow wound healing . Abnormal bleeding which may be life-threatening - symptoms may be coughing up blood, throwing up blood (may look like coffee grounds), red or black tarry bowel movements, abnormally heavy menstrual flow, nosebleeds or any other unusual bleeding. . Blood clots and  events such as stroke and heart attack. A blood clot in your leg may cause your leg to swell, appear red and warm, and/or cause pain. A blood clot in your lungs may cause trouble breathing, pain when breathing, and/or chest pain. . Severe high blood pressure . Changes in your central nervous system can happen. The central nervous system is made up of your brain and spinal cord. You could feel extreme tiredness, agitation, confusion, hallucinations (see or hear things that are not there), trouble understanding or speaking, loss of control of your bowels or bladder, eyesight changes, numbness or lack of strength to your arms, legs, face, or body, and coma. If you start to have any of these symptoms let your doctor know right away. . Proteins in your urine, which can rarely cause kidney failure . While you are getting this drug in your vein (IV), you may have a reaction to the drug. Sometimes you may be given medication to stop or lessen these side effects. Your nurse will check you closely for these signs: fever or shaking chills, flushing, facial swelling, feeling dizzy, headache, trouble breathing, rash, itching, chest tightness, or chest pain. These reactions may happen for 24 hours after your infusion. If this happens, call 911 for emergency care. . In women, changes in your ovaries may happen that may cause menstrual bleeding to become irregular or stop, and may impair fertility Note: Some of the side effects above are very rare. If you have concerns and/or questions, please discuss them with your medical team.  Important Information . Bevacizumab may cause slow wound healing. It should not be given within 28 days of surgery or any test or procedure that needs conscious sedation. If you must have emergency surgery or have an accident that results in a wound, tell the doctor that you are on bevacizumab. Call your cancer doctor as soon as possible for further orders. . This drug may be present in the  saliva, tears, sweat, urine, stool, vomit, semen, and vaginal secretions. Talk to your doctor and/or your nurse about the necessary precautions to take  during this time.  Treating Side Effects . Keeping your pain under control is important to your well-being. Please tell your doctor or nurse if you are experiencing pain . Taking good care of your mouth may help food taste better and improve your appetite . If you have a nose bleed, sit with your head tipped slightly forward. Apply pressure by lightly pinching the bridge of your nose between your thumb and forefinger. Call your doctor if you feel dizzy or faint or if the bleeding doesn't stop after 10 to 15 minutes. . To help with dry skin, moisturize your skin several times day . Avoid sun exposure and apply sunscreen routinely when outdoors . If you get a rash do not put anything on it unless your doctor or nurse says you may. Keep the area around the rash clean and dry. Ask your doctor for medicine if your rash bothers you. . Infusion reactions may happen for 24 hours after your infusion. If this happens, call 911 for emergency care.  Food and Drug Interactions . There are no known interactions of bevacizumab with food . This drug may interact with other medicines. Tell your doctor and pharmacist about all the medicines and dietary supplements (vitamins, minerals, herbs and others) that you are taking at this time. The safety and use of dietary supplements and alternative diets are often not known. Using these might affect your cancer or interfere with your treatment. Until more is known, you should not use dietary supplements or alternative diets without your cancer doctor's help.  When to Call the Doctor Call your doctor or nurse if you have any of these symptoms and/or any new or unusual symptoms: . Fever of 100.5 F (38 C) or higher . Chills . Wheezing and/or trouble breathing . Difficulty swallowing . Easy bleeding or bruising .  Blood in your urine, vomit (bright red or coffee-ground) and/or stools ( bright red, or black/tarry) . Coughing up blood . Confusion and/or agitation . Hallucinations . Trouble understanding or speaking . Headache that does not go away . Nose bleed that doesn't stop bleeding after 10 -15 minutes . Feeling dizzy or lightheaded . Blurry vision or changes in your eyesight . Numbness or lack of strength to your arms, legs, face, or body . Nausea that stops you from eating or drinking or relieved by prescribed medicine . Throwing up more than 3 times a day . Pain in your abdomen that does not go away . Signs of infusion reaction: fever or shaking chills, flushing, facial swelling, feeling dizzy, headache, trouble breathing, rash, itching, chest tightness, or chest pain. . Pain that does not go away or is not relieved by prescribed medicine . Your leg or arm is swollen, red, warm and/or painful . Chest pain or symptoms of a heart attack. Most heart attacks involve pain in the center of the chest that lasts more than a few minutes. The pain may go away and come back. It can feel like pressure, squeezing, fullness, or pain. Sometimes pain is felt in one or both arms, the back, neck, jaw, or stomach. If any of these symptoms last 2 minutes, call 911. Marland Kitchen Symptoms of a stroke such as sudden numbness or weakness of your face, arm, or leg, mostly on one side of your body; sudden confusion, trouble speaking or understanding; sudden trouble seeing in one or both eyes; sudden trouble walking, feeling dizzy, loss of balance or coordination; or sudden, bad headache with no known cause. If you have any  of these symptoms for 2 minutes, call 911. . If you think you may be pregnant  Reproduction Warnings . Pregnancy warning: This drug can have harmful effects on the unborn baby. Women of child bearing potential should use effective methods of birth control during your cancer treatment and for at least 6 months after  treatment. Let your doctor know right away if you think you may be pregnant . Breastfeeding warning: Women should not breast feed during treatment because this drug could enter the breast milk and cause harm to a breast feeding baby. . Fertility warning: This drug may affect your ability to have children in the future. Talk with your doctor or nurse if you plan to have children. Ask for information on egg banking.  Leucovorin Calcium  About This Drug Leucovorin is a vitamin. It is used in combination with other cancer fighting drugs such as 5-fluorouracil.  Leucovorin is given in the vein (IV) or by mouth (orally).  Possible Side Effects . Rash and itching Note: Leucovorin by itself has very few side effects. Other side effects you may have can be caused by the other drugs you are taking, such as 5-fluorouracil or methotrexate.  Warnings and Precautions . Allergic reactions, including anaphylaxis are rare but may happen in some patients. Signs of allergic reaction to this drug may be swelling of the face, feeling like your tongue or throat are swelling, trouble breathing, rash, itching, fever, chills, feeling dizzy, and/or feeling that your heart is beating in a fast or not normal way. If this happens, do not take another dose of this drug. You should get urgent medical treatment.  Treating Side Effects . If you get a rash do not put anything on it unless your doctor or nurse says you may. Keep the area around the rash clean and dry. Ask your doctor for medicine if your rash bothers you.  Food and Drug Interactions . There are no known interactions of leucovorin with food. . This drug may interact with other medicines. Tell your doctor and pharmacist about all the prescription and over-the-counter medicines and dietary supplements (vitamins, minerals, herbs and others) that you are taking at this time. The safety and use of dietary supplements and alternative diets are often not known. Using  these might affect your cancer or interfere with your treatment. Until more is known, you should not use dietary supplements or alternative diets without your cancer doctor's help.  When to Call the Doctor Call your doctor or nurse if you have any of these symptoms and/or any new or unusual symptoms: . A new rash or a rash that is not relieved by prescribed medicines . Signs of allergic reaction: swelling of the face, feeling like your tongue or throat are swelling, trouble breathing, rash, itching, fever, chills, feeling dizzy, and/or feeling that your heart is beating in a fast or not normal way . If you think you may be pregnant  Reproduction Warnings . Pregnancy warning: It is not known if this drug may harm an unborn child. For this reason, be sure to talk with your doctor if you are pregnant or planning to become pregnant while receiving this drug. Let your doctor know right away if you think you may be pregnant . Breastfeeding warning: It is not known if this drug passes into breast milk. For this reason, women should talk to their doctor about the risks and benefits of breastfeeding during treatment with this drug because this drug may enter the breast milk and  cause harm to a breastfeeding baby. . Fertility warning: Human fertility studies have not been done with this drug. Talk with your doctor or nurse if you plan to have children. Ask for information on sperm or egg banking.  Oxaliplatin (Eloxatin)  About This Drug Oxaliplatin is used to treat cancer. It is given in the vein (IV).  Possible Side Effects . Bone marrow depression. This is a decrease in the number of white blood cells, red blood cells, and platelets. This may raise your risk of infection, make you tired and weak (fatigue), and raise your risk of bleeding. . Tiredness . Soreness of the mouth and throat. You may have red areas, white patches, or sores that hurt. . Nausea and throwing up (vomiting) . Loose bowel  movements (diarrhea) . Changes in your liver function . Effects on the nerves are called peripheral neuropathy. You may feel numbness, tingling, or pain in your hands and feet, and may be worse in cold temperatures. It may be hard for you to button your clothes, open jars, or walk as usual. The effect on the nerves may get worse with more doses of the drug. These effects get better in some people after the drug is stopped but it does not get better in all people Note: Each of the side effects above was reported in 40% or greater of patients treated with oxaliplatin. Not all possible side effects are included above.  Warnings and Precautions . Allergic reactions, including anaphylaxis, which may be life-threatening are rare but may happen in some patients. Signs of allergic reaction to this drug may be swelling of the face, feeling like your tongue or throat are swelling, trouble breathing, rash, itching, fever, chills, feeling dizzy, and/or feeling that your heart is beating in a fast or not normal way. If this happens, you should get urgent medical treatment. . Inflammation (swelling) of the lungs, which may be life-threatening. You may have a dry cough or trouble breathing. . Effects on the nerves (neuropathy) may resolve within 14 days, or it may persist beyond 14 days. . Severe decrease in white blood cells when combined with the chemotherapy agents 5-fluorouracil and leucovorin. This may be life-threatening. . Severe changes in your liver function . Abnormal heart beat and/or EKG, which can be life-threatening . Rhabdomyolysis- damage to your muscles which may release proteins in your blood and affect how your kidneys work, which can be life-threatening. You may have severe muscle weakness and/or pain, or dark urine.  Important Information . This drug may be present in the saliva, tears, sweat, urine, stool, vomit, semen, and vaginal secretions. Talk to your doctor and/or your nurse about the  necessary precautions to take during this time. . The effects on the nerves can be aggravated by exposure to cold. Avoid cold beverages, use of ice and make sure you cover your skin and dress warmly prior to being exposed to cold temperatures while you are receiving treatment with oxaliplatin.  Treating Side Effects . Manage tiredness by pacing your activities for the day. . Be sure to include periods of rest between energy-draining activities. . To decrease infection, wash your hands regularly. . Avoid close contact with people who have a cold, the flu, or other infections. . Take your temperature as your doctor or nurse tells you, and whenever you feel like you may have a fever. . To help decrease bleeding, use a soft toothbrush. Check with your nurse before using dental floss. . Be very careful when using knives  or tools. . Use an electric shaver instead of a razor. . Drink plenty of fluids (a minimum of eight glasses per day is recommended). . Mouth care is very important. Your mouth care should consist of routine, gentle cleaning of your teeth or dentures and rinsing your mouth with a mixture of 1/2 teaspoon of salt in 8 ounces of water or  teaspoon of baking soda in 8 ounces of water. This should be done at least after each meal and at bedtime. . If you have mouth sores, avoid mouthwash that has alcohol. Also avoid alcohol and smoking because they can bother your mouth and throat. . To help with nausea and vomiting, eat small, frequent meals instead of three large meals a day. Choose foods and drinks that are at room temperature. Ask your nurse or doctor about other helpful tips and medicine that is available to help or stop lessen these symptoms. . If you throw up or have loose bowel movements, you should drink more fluids so that you do not become dehydrated (lack water in the body from losing too much fluid). . If you get diarrhea, eat low-fiber foods that are high in protein and calories  and avoid foods that can irritate your digestive tracts or lead to cramping. . Ask your nurse or doctor about medicine that can lessen or stop your diarrhea. . If you have numbness and tingling in your hands and feet, be careful when cooking, walking, and handling sharp objects and hot liquids. . Do not drink cold drinks or use ice in beverages. Drink fluids at room temperature or warmer, and drink through a straw. . Wear gloves to touch cold objects, and wear warm clothing and cover you skin during cold weather.  Food and Drug Interactions . There are no known interactions of oxaliplatin with food and other medications. . Tell your doctor and pharmacist about all the prescription and over-the-counter medicines and dietary supplements (vitamins, minerals, herbs and others) that you are taking at this time. The safety and use of dietary supplements and alternative diets are often not known. Using these might affect your cancer or interfere with your treatment. Until more is known, you should not use dietary supplements or alternative diets without your cancer doctor's help.  When to Call the Doctor Call your doctor or nurse if you have any of these symptoms and/or any new or unusual symptoms: . Fever of 100.5 F (38 C) or higher . Chills . Fatigue that interferes with your daily activities . Feeling dizzy or lightheaded . Easy bleeding or bruising . Pain in your chest . Dry cough . Trouble breathing . Pain in your mouth or throat that makes it hard to eat or drink . Nausea that stops you from eating or drinking and/or is not relieved by prescribed medicines . Throwing up more than 3 times a day . Loose bowel movements (diarrhea) 4 times a day or loose bowel movements with lack of strength or a feeling of being dizzy . Numbness, tingling, or pain your hands and feet . Feeling that your heart is beating in a fast or not normal way (palpitations) . Signs of possible liver problems: dark urine,  pale bowel movements, bad stomach pain, feeling very tired and weak, unusual itching, or yellowing of the eyes or skin . Signs of rhabdomyolysis: decreased urine, very dark urine, muscle pain in the shoulders, thighs, or lower back; muscle weakness or trouble moving arms and legs . Signs of allergic reaction: swelling of  the face, feeling like your tongue or throat are swelling, trouble breathing, rash, itching, fever, chills, feeling dizzy, and/or feeling that your heart is beating in a fast or not normal way . If you think you may be pregnant  Reproduction Warnings . Pregnancy warning: This drug may have harmful effects on the unborn baby. Women of child bearing potential should use effective methods of birth control during your cancer treatment. Let your doctor know right away if you think you may be pregnant or may have impregnated your partner. . Breastfeeding warning: It is not known if this drug passes into breast milk. For this reason, women should talk to their doctor about the risks and benefits of breast feeding during treatment with this drug because this drug may enter the breast milk and cause harm to a breast feeding baby. . Fertility warning: Human fertility studies have not been done with this drug. Talk with your doctor or nurse if you plan to have children. Ask for information on sperm or egg banking.    SELF CARE ACTIVITIES WHILE ON CHEMOTHERAPY:  Hydration Increase your fluid intake 48 hours prior to treatment and drink at least 8 to 12 cups (64 ounces) of water/decaffeinated  beverages per day after treatment. You can still have your cup of coffee or soda but these beverages do not count as part of your 8 to 12 cups that you need to drink daily. No alcohol intake.  Medications Continue taking your normal prescription medication as prescribed.  If you start any new herbal or new supplements please let us know first to make sure it is safe.  Mouth Care Have teeth  cleaned professionally before starting treatment. Keep dentures and partial plates clean. Use soft toothbrush and do not use mouthwashes that contain alcohol. Biotene is a good mouthwash that is available at most pharmacies or may be ordered by calling [phone]. Use warm salt water gargles (1 teaspoon salt per 1 quart warm water) before and after meals and at bedtime. Or you may rinse with 2 tablespoons of three-percent hydrogen peroxide mixed in eight ounces of water. If you are still having problems with your mouth or sores in your mouth please call the clinic. If you need dental work, please let the doctor know before you go for your appointment so that we can coordinate the best possible time for you in regards to your chemo regimen. You need to also let your dentist know that you are actively taking chemo. We may need to do labs prior to your dental appointment.   Skin Care Always use sunscreen that has not expired and with SPF (Sun Protection Factor) of 50 or higher. Wear hats to protect your head from the sun. Remember to use sunscreen on your hands, ears, face, & feet.  Use good moisturizing lotions such as udder cream, eucerin, or even Vaseline. Some chemotherapies can cause dry skin, color changes in your skin and nails.    . Avoid long, hot showers or baths. . Use gentle, fragrance-free soaps and laundry detergent. . Use moisturizers, preferably creams or ointments rather than lotions because the thicker consistency is better at preventing skin dehydration. Apply the cream or ointment within 15 minutes of showering. Reapply moisturizer at night, and moisturize your hands every time after you wash them.   Hair Loss (if your doctor says your hair will fall out)  . If your doctor says that your hair is likely to fall out, decide before you begin chemo whether  you want to wear a wig. You may want to shop before treatment to match your hair color. . Hats, turbans, and scarves can also  camouflage hair loss, although some people prefer to leave their heads uncovered. If you go bare-headed outdoors, be sure to use sunscreen on your scalp. . Cut your hair short. It eases the inconvenience of shedding lots of hair, but it also can reduce the emotional impact of watching your hair fall out. . Don't perm or color your hair during chemotherapy. Those chemical treatments are already damaging to hair and can enhance hair loss. Once your chemo treatments are done and your hair has grown back, it's OK to resume dyeing or perming hair. With chemotherapy, hair loss is almost always temporary. But when it grows back, it may be a different color or texture. In older adults who still had hair color before chemotherapy, the new growth may be completely gray.  Often, new hair is very fine and soft.  Infection Prevention Please wash your hands for at least 30 seconds using warm soapy water. Handwashing is the #1 way to prevent the spread of germs. Stay away from sick people or people who are getting over a cold. If you develop respiratory systems such as green/yellow mucus production or productive cough or persistent cough let us know and we will see if you need an antibiotic. It is a good idea to keep a pair of gloves on when going into grocery stores/[employer] to decrease your risk of coming into contact with germs on the carts, etc. Carry alcohol hand gel with you at all times and use it frequently if out in public. If your temperature reaches 100.5 or higher please call the clinic and let us know.  If it is after hours or on the weekend please go to the ER if your temperature is over 100.5.  Please have your own personal thermometer at home to use.    Sex and bodily fluids If you are going to have sex, a condom must be used to protect the person that isn't taking chemotherapy. Chemo can decrease your libido (sex drive). For a few days after chemotherapy, chemotherapy can be excreted through your bodily  fluids.  When using the toilet please close the lid and flush the toilet twice.  Do this for a few day after you have had chemotherapy.    Effects of chemotherapy on your sex life Some changes are simple and won't last long. They won't affect your sex life permanently. Sometimes you may feel: . too tired . not strong enough to be very active . sick or sore  . not in the mood . anxious or low Your anxiety might not seem related to sex. For example, you may be worried about the cancer and how your treatment is going. Or you may be worried about money, or about how you family are coping with your illness. These things can cause stress, which can affect your interest in sex. It's important to talk to your partner about how you feel. Remember - the changes to your sex life don't usually last long. There's usually no medical reason to stop having sex during chemo. The drugs won't have any long term physical effects on your performance or enjoyment of sex. Cancer can't be passed on to your partner during sex  Contraception It's important to use reliable contraception during treatment. Avoid getting pregnant while you or your partner are having chemotherapy. This is because the drugs may harm  the baby. Sometimes chemotherapy drugs can leave a man or woman infertile.  This means you would not be able to have children in the future. You might want to talk to someone about permanent infertility. It can be very difficult to learn that you may no longer be able to have children. Some people find counselling helpful. There might be ways to preserve your fertility, although this is easier for men than for women. You may want to speak to a fertility expert. You can talk about sperm banking or harvesting your eggs. You can also ask about other fertility options, such as donor eggs. If you have or have had breast cancer, your doctor might advise you not to take the contraceptive pill. This is because the hormones in  it might affect the cancer.  It is not known for sure whether or not chemotherapy drugs can be passed on through semen or secretions from the vagina. Because of this some doctors advise people to use a barrier method if you have sex during treatment. This applies to vaginal, anal or oral sex. Generally, doctors advise a barrier method only for the time you are actually having the treatment and for about a week after your treatment. Advice like this can be worrying, but this does not mean that you have to avoid being intimate with your partner. You can still have close contact with your partner and continue to enjoy sex.  Animals If you have cats or birds we just ask that you not change the litter or change the cage.  Please have someone else do this for you while you are on chemotherapy.   Food Safety During and After Cancer Treatment Food safety is important for people both during and after cancer treatment. Cancer and cancer treatments, such as chemotherapy, radiation therapy, and stem cell/bone marrow transplantation, often weaken the immune system. This makes it harder for your body to protect itself from foodborne illness, also called food poisoning. Foodborne illness is caused by eating food that contains harmful bacteria, parasites, or viruses.  Foods to avoid Some foods have a higher risk of becoming tainted with bacteria. These include: Marland Kitchen Unwashed fresh fruit and vegetables, especially leafy vegetables that can hide dirt and other contaminants . Raw sprouts, such as alfalfa sprouts . Raw or undercooked beef, especially ground beef, or other raw or undercooked meat and poultry . Fatty, fried, or spicy foods immediately before or after treatment.  These can sit heavy on your stomach and make you feel nauseous. . Raw or undercooked shellfish, such as oysters. . Sushi and sashimi, which often contain raw fish.  . Unpasteurized beverages, such as unpasteurized fruit juices, raw milk, raw  yogurt, or cider . Undercooked eggs, such as soft boiled, over easy, and poached; raw, unpasteurized eggs; or foods made with raw egg, such as homemade raw cookie dough and homemade mayonnaise Simple steps for food safety Shop smart. . Do not buy food stored or displayed in an unclean area. . Do not buy bruised or damaged fruits or vegetables. . Do not buy cans that have cracks, dents, or bulges. . Pick up foods that can spoil at the end of your shopping trip and store them in a cooler on the way home. Prepare and clean up foods carefully. . Rinse all fresh fruits and vegetables under running water, and dry them with a clean towel or paper towel. . Clean the top of cans before opening them. . After preparing food, wash your hands for  20 seconds with hot water and soap. Pay special attention to areas between fingers and under nails. . Clean your utensils and dishes with hot water and soap. Marland Kitchen Disinfect your kitchen and cutting boards using 1 teaspoon of liquid, unscented bleach mixed into 1 quart of water.   Dispose of old food. . Eat canned and packaged food before its expiration date (the "use by" or "best before" date). . Consume refrigerated leftovers within 3 to 4 days. After that time, throw out the food. Even if the food does not smell or look spoiled, it still may be unsafe. Some bacteria, such as Listeria, can grow even on foods stored in the refrigerator if they are kept for too long. Take precautions when eating out. . At restaurants, avoid buffets and salad bars where food sits out for a long time and comes in contact with many people. Food can become contaminated when someone with a virus, often a norovirus, or another "bug" handles it. . Put any leftover food in a "to-go" container yourself, rather than having the server do it. And, refrigerate leftovers as soon as you get home. . Choose restaurants that are clean and that are willing to prepare your food as you order it  cooked.    MEDICATIONS:  Dexamethasone 4mg  tablet.  Take 2 tablets (8 mg total) by mouth daily. Start the day after chemotherapy for 2 days. Take with food.  Take with food.                                                                                                                                                           Zofran/Ondansetron 8mg  tablet. Take 1 tablet every 8 hours as needed for nausea/vomiting. (#1 nausea med to take, this can constipate)  Compazine/Prochlorperazine 10mg  tablet. Take 1 tablet every 6 hours as needed for nausea/vomiting. (#2 nausea med to take, this can make you sleepy)  EMLA cream. Apply a quarter size amount to port site 1 hour prior to chemo. Do not rub in. Cover with plastic wrap.   Over-the-Counter Meds:  Miralax 1 capful in 8 oz. of fluid daily. May increase to two times a day if needed. This is a stool softener. If this doesn't work proceed you can add:  Senokot S-start with 1 tablet two times a day and increase to 4 tablets two times a day if needed. (Total of 8 tablets in a 24 hour period). This is a stimulant laxative.   Call us if this does not help your bowels move.   Imodium 2mg  capsule. Take 2 capsules after the 1st loose stool and then 1 capsule every 2 hours until you go a total of 12 hours without having a loose stool. Call the [facility] if loose stools continue. If diarrhea occurs at bedtime, take  2 capsules at bedtime. Then take 2 capsules every 4 hours until morning. Call [facility].     Constipation Sheet *Miralax in 8 oz. of fluid daily.  May increase to two times a day if needed.  This is a stool softener.  If this not enough to keep your bowel regular:  You can add:  *Senokot S. Start with one tablet twice a day and can increase to 4 tablets twice a day if needed.  This is a stimulant laxative.   Sometimes when you take pain medication you need BOTH a medicine to keep your stool soft and a medicine to help your  bowel push it out!  Please call if the above does not work for you.   Do not go more than 2 days without a bowel movement.  It is very important that you do not become constipated.  It will make you feel sick to your stomach (nausea) and can cause abdominal pain and vomiting.    Diarrhea Sheet  If you are having loose stools/diarrhea, please purchase Imodium and begin taking as outlined:  At the first sign of poorly formed or loose stools you should begin taking Imodium (loperamide) 2 mg capsules.  Take two caplets (4mg ) followed by one caplet (2mg ) every 2 hours until you have had no diarrhea for 12 hours.  During the night take two caplets (4mg ) at bedtime and continue every 4 hours during the night until the morning.  Stop taking Imodium only after there is no sign of diarrhea for 12 hours.    Always call the [facility] if you are having loose stools/diarrhea that you can't get under control.  Loose stools/diarrhea leads to dehydration (loss of water) in your body.  We have other options of trying to get the loose stools/diarrhea to stop but you must let us know!    Nausea Sheet  Zofran/Ondansetron 8mg  tablet. Take 1 tablet every 8 hours as needed for nausea/vomiting. (#1 nausea med to take, this can constipate)  Compazine/Prochlorperazine 10mg  tablet. Take 1 tablet every 6 hours as needed for nausea/vomiting. (#2 nausea med to take, this can make you sleepy)  You can take these medications together or separately.  We would first like for you to try the Ondansetron by itself and then take the Prochloperizine if needed. But you are allowed to take both medications at the same time if your nausea is that severe.  If you are having persistent nausea (nausea that does not stop) please take these medications on a staggered schedule so that the nausea medication stays in your body.  Please call the [facility] and let us know the amount of nausea that you are experiencing.  If you begin to  vomit, you need to call the [facility] and if it is the weekend and you have vomited more than one time and can't get it to stop-go to the Emergency Room.  Persistent nausea/vomiting can lead to dehydration (loss of fluid in your body) and will make you feel terrible.   Ice chips, sips of clear liquids, foods that are at room temperature, crackers, and toast tend to be better tolerated.     SYMPTOMS TO REPORT AS SOON AS POSSIBLE AFTER TREATMENT:  FEVER GREATER THAN 100.5 F  CHILLS WITH OR WITHOUT FEVER  NAUSEA AND VOMITING THAT IS NOT CONTROLLED WITH YOUR NAUSEA MEDICATION  UNUSUAL SHORTNESS OF BREATH  UNUSUAL BRUISING OR BLEEDING  TENDERNESS IN MOUTH AND THROAT WITH OR WITHOUT PRESENCE OF  ULCERS  URINARY PROBLEMS  BOWEL PROBLEMS  UNUSUAL RASH    Wear comfortable clothing and clothing appropriate for easy access to your [facility]. Let us know if there is anything that we can do to make your therapy better!     What to do if you need assistance after hours or on the weekends: CALL [phone].  HOLD on the line, do not hang up.  You will hear multiple messages but at the end you will be connected with a nurse triage line.  They will contact the doctor if necessary.  Most of the time they will be able to assist you.  Do not call the hospital operator.     I have been informed and understand all of the instructions given to me and have received a copy. I have been instructed to call the clinic [phone] or my family physician as soon as possible for continued medical care, if indicated. I do not have any more questions at this time but understand that I may call the [facility] or the Patient Navigator at [phone] during office hours should I have questions or need assistance in obtaining follow-up care.

## 2016-12-11 ENCOUNTER — Ambulatory Visit (HOSPITAL_COMMUNITY)
Admission: RE | Admit: 2016-12-11 | Discharge: 2016-12-11 | Disposition: A | Discharge status: Home / Self Care | Payer: Medicare PPO | Source: Ambulatory Visit | Attending: Adult Health | Admitting: Adult Health

## 2016-12-11 ENCOUNTER — Other Ambulatory Visit (HOSPITAL_COMMUNITY): Payer: Self-pay | Admitting: Adult Health

## 2016-12-11 ENCOUNTER — Encounter (HOSPITAL_COMMUNITY): Payer: Self-pay

## 2016-12-11 ENCOUNTER — Encounter (HOSPITAL_COMMUNITY): Payer: Medicare PPO | Attending: Adult Health

## 2016-12-11 ENCOUNTER — Other Ambulatory Visit: Payer: Self-pay | Admitting: Student

## 2016-12-11 DIAGNOSIS — F17210 Nicotine dependence, cigarettes, uncomplicated: Secondary | ICD-10-CM | POA: Insufficient documentation

## 2016-12-11 DIAGNOSIS — C787 Secondary malignant neoplasm of liver and intrahepatic bile duct: Principal | ICD-10-CM

## 2016-12-11 DIAGNOSIS — R1084 Generalized abdominal pain: Secondary | ICD-10-CM | POA: Insufficient documentation

## 2016-12-11 DIAGNOSIS — C7800 Secondary malignant neoplasm of unspecified lung: Secondary | ICD-10-CM

## 2016-12-11 DIAGNOSIS — E119 Type 2 diabetes mellitus without complications: Secondary | ICD-10-CM | POA: Insufficient documentation

## 2016-12-11 DIAGNOSIS — Z7982 Long term (current) use of aspirin: Secondary | ICD-10-CM | POA: Diagnosis not present

## 2016-12-11 DIAGNOSIS — C189 Malignant neoplasm of colon, unspecified: Secondary | ICD-10-CM | POA: Diagnosis present

## 2016-12-11 DIAGNOSIS — Z23 Encounter for immunization: Secondary | ICD-10-CM | POA: Insufficient documentation

## 2016-12-11 HISTORY — PX: IR US GUIDE BX ASP/DRAIN: IMG2392

## 2016-12-11 HISTORY — DX: Dyspnea, unspecified: R06.00

## 2016-12-11 HISTORY — PX: IR US GUIDE VASC ACCESS RIGHT: IMG2390

## 2016-12-11 HISTORY — DX: Essential (primary) hypertension: I10

## 2016-12-11 HISTORY — DX: Malignant (primary) neoplasm, unspecified: C80.1

## 2016-12-11 HISTORY — PX: IR FLUORO GUIDE PORT INSERTION RIGHT: IMG5741

## 2016-12-11 LAB — CBC
HCT: 30.5 % — ABNORMAL LOW (ref 39.0–52.0)
HEMOGLOBIN: 10 g/dL — AB (ref 13.0–17.0)
MCH: 29.8 pg (ref 26.0–34.0)
MCHC: 32.8 g/dL (ref 30.0–36.0)
MCV: 90.8 fL (ref 78.0–100.0)
Platelets: 349 10*3/uL (ref 150–400)
RBC: 3.36 MIL/uL — AB (ref 4.22–5.81)
RDW: 15.2 % (ref 11.5–15.5)
WBC: 10.4 10*3/uL (ref 4.0–10.5)

## 2016-12-11 LAB — GLUCOSE, CAPILLARY: GLUCOSE-CAPILLARY: 126 mg/dL — AB (ref 65–99)

## 2016-12-11 LAB — PROTIME-INR
INR: 1.15
PROTHROMBIN TIME: 14.6 s (ref 11.4–15.2)

## 2016-12-11 LAB — APTT: APTT: 31 s (ref 24–36)

## 2016-12-11 MED ORDER — LIDOCAINE HCL 2 % IJ SOLN
INTRAMUSCULAR | Status: AC
  Filled 2016-12-11: qty 10

## 2016-12-11 MED ORDER — LIDOCAINE-EPINEPHRINE (PF) 2 %-1:200000 IJ SOLN
INTRAMUSCULAR | Status: AC
  Filled 2016-12-11: qty 20

## 2016-12-11 MED ORDER — MIDAZOLAM HCL 2 MG/2ML IJ SOLN
INTRAMUSCULAR | Status: AC | PRN
  Administered 2016-12-11 (×2): 1 mg via INTRAVENOUS

## 2016-12-11 MED ORDER — MIDAZOLAM HCL 2 MG/2ML IJ SOLN
INTRAMUSCULAR | Status: AC
  Filled 2016-12-11: qty 6

## 2016-12-11 MED ORDER — LIDOCAINE-EPINEPHRINE (PF) 1 %-1:200000 IJ SOLN
INTRAMUSCULAR | Status: AC | PRN
  Administered 2016-12-11: 20 mL

## 2016-12-11 MED ORDER — FENTANYL CITRATE (PF) 100 MCG/2ML IJ SOLN
INTRAMUSCULAR | Status: AC | PRN
  Administered 2016-12-11 (×2): 50 ug via INTRAVENOUS

## 2016-12-11 MED ORDER — FENTANYL CITRATE (PF) 100 MCG/2ML IJ SOLN
INTRAMUSCULAR | Status: AC
  Filled 2016-12-11: qty 4

## 2016-12-11 MED ORDER — HEPARIN SOD (PORK) LOCK FLUSH 100 UNIT/ML IV SOLN
INTRAVENOUS | Status: AC
  Filled 2016-12-11: qty 5

## 2016-12-11 MED ORDER — HEPARIN SOD (PORK) LOCK FLUSH 100 UNIT/ML IV SOLN
INTRAVENOUS | Status: AC | PRN
  Administered 2016-12-11: 500 [IU] via INTRAVENOUS

## 2016-12-11 MED ORDER — CEFAZOLIN SODIUM-DEXTROSE 2-4 GM/100ML-% IV SOLN
2.0000 g | Freq: Once | INTRAVENOUS | Status: AC
  Administered 2016-12-11: 2 g via INTRAVENOUS

## 2016-12-11 MED ORDER — CEFAZOLIN SODIUM-DEXTROSE 2-4 GM/100ML-% IV SOLN
INTRAVENOUS | Status: AC
  Administered 2016-12-11: 2 g via INTRAVENOUS
  Filled 2016-12-11: qty 100

## 2016-12-11 MED ORDER — SODIUM CHLORIDE 0.9 % IV SOLN
INTRAVENOUS | Status: DC
  Administered 2016-12-11: 12:00:00 via INTRAVENOUS

## 2016-12-11 MED ORDER — LIDOCAINE HCL (PF) 1 % IJ SOLN
INTRAMUSCULAR | Status: AC
  Filled 2016-12-11: qty 30

## 2016-12-11 NOTE — Sedation Documentation (Signed)
Starting liver bx

## 2016-12-11 NOTE — Progress Notes (Signed)
Went in to introduce myself during teaching this morning. Explained who I was and my information given.  Dietician consult placed with Burtis Junes, if he can not do it I will place one with Joli.

## 2016-12-11 NOTE — Progress Notes (Signed)
Patient's daughter-in-law, Cory Robinson, is present and is an active part in patient's care.  Chemotherapy teaching completed.  Medications and their possible side effects explained.  How to treat side effects both here and at home explained to patient.  Food and drug interactions discussed with patient for each drug.  Importance of fevers and list of symptoms of when to call the physician discussed in detail.  His family report that he has a hard time with drinking and eating.  Importance of hydration discussed with patient.  Self-care activities explained.  Advised patient to take home medications as usual.  Proper mouth and skin care discussed with patient.  Infection prevention stressed with patient and the importance of hand washing.  Proper food preparation and food safety for during and after chemotherapy discussed with patient.  Over the counter medications discussed with patient.  Diarrhea, constipation and nausea/vomiting sheet provided for patient.  Patient is currently having trouble with constipation so, Constipation sheet discussed in detail and OTC medications discussed with patient in more detail, as well as when he should start taking these medications.  Discussed with patient what to do the day of chemo: what to wear, what to eat, what to bring and do while he is here.  Discussed with patient the contact persons here at the office during and after hours.  Phone numbers for both triage nurse and nurse navigator given to patient and their office hours.  Patient verbalizes understanding and all questions have been answered today.  Consent for Folfox and Avastin signed and dated by patient after all questions answered.  A copy of teaching material, schedule, calendar with important dates, and all information given to patient's daughter-in-law.  A copy of this is available in the discharge instructions.

## 2016-12-11 NOTE — Discharge Instructions (Signed)
Moderate Conscious Sedation, Adult, Care After These instructions provide you with information about caring for yourself after your procedure. Your health care provider may also give you more specific instructions. Your treatment has been planned according to current medical practices, but problems sometimes occur. Call your health care provider if you have any problems or questions after your procedure. What can I expect after the procedure? After your procedure, it is common:  To feel sleepy for several hours.  To feel clumsy and have poor balance for several hours.  To have poor judgment for several hours.  To vomit if you eat too soon.  Follow these instructions at home: For at least 24 hours after the procedure:   Do not: ? Participate in activities where you could fall or become injured. ? Drive. ? Use heavy machinery. ? Drink alcohol. ? Take sleeping pills or medicines that cause drowsiness. ? Make important decisions or sign legal documents. ? Take care of children on your own.  Rest. Eating and drinking  Follow the diet recommended by your health care provider.  If you vomit: ? Drink water, juice, or soup when you can drink without vomiting. ? Make sure you have little or no nausea before eating solid foods. General instructions  Have a responsible adult stay with you until you are awake and alert.  Take over-the-counter and prescription medicines only as told by your health care provider.  If you smoke, do not smoke without supervision.  Keep all follow-up visits as told by your health care provider. This is important. Contact a health care provider if:  You keep feeling nauseous or you keep vomiting.  You feel light-headed.  You develop a rash.  You have a fever. Get help right away if:  You have trouble breathing. This information is not intended to replace advice given to you by your health care provider. Make sure you discuss any questions you  have with your health care provider. Document Released: 11/13/2012 Document Revised: 06/28/2015 Document Reviewed: 05/15/2015 Elsevier Interactive Patient Education  2018 [employer].   Liver Biopsy, Care After These instructions give you information on caring for yourself after your procedure. Your doctor may also give you more specific instructions. Call your doctor if you have any problems or questions after your procedure. Follow these instructions at home:  Rest at home for 1-2 days or as told by your doctor.  Have someone stay with you for at least 24 hours.  Do not do these things in the first 24 hours: ? Drive. ? Use machinery. ? Take care of other people. ? Sign legal documents. ? Take a bath or shower.  There are many different ways to close and cover a cut (incision). For example, a cut can be closed with stitches, skin glue, or adhesive strips. Follow your doctor's instructions on: ? Taking care of your cut. ? Changing and removing your bandage (dressing).  You may remove dressing tomorrow 12/12/16. ? Removing whatever was used to close your cut.  Do not drink alcohol in the first week.  Do not lift more than 5 pounds or play contact sports for the first 2 weeks.  Take medicines only as told by your doctor.   Get your test results. Contact a doctor if:  A cut bleeds and leaves more than just a small spot of blood.  A cut is red, puffs up (swells), or hurts more than before.  Fluid or something else comes from a cut.  A cut smells  bad.  You have a fever or chills. Get help right away if:  You have swelling, bloating, or pain in your belly (abdomen).  You get dizzy or faint.  You have a rash.  You feel sick to your stomach (nauseous) or throw up (vomit).  You have trouble breathing, feel short of breath, or feel faint.  Your chest hurts.  You have problems talking or seeing.  You have trouble balancing or moving your arms or legs. This  information is not intended to replace advice given to you by your health care provider. Make sure you discuss any questions you have with your health care provider. Document Released: 11/02/2007 Document Revised: 07/01/2015 Document Reviewed: 03/21/2013 Elsevier Interactive Patient Education  2018 [place] Insertion, Care After This sheet gives you information about how to care for yourself after your procedure. Your health care provider may also give you more specific instructions. If you have problems or questions, contact your health care provider. What can I expect after the procedure? After your procedure, it is common to have:  Discomfort at the port insertion site.  Bruising on the skin over the port. This should improve over 3-4 days.  Follow these instructions at home: [employer] care  After your port is placed, you will get a manufacturer's information card. The card has information about your port. Keep this card with you at all times.  Take care of the port as told by your health care provider. Ask your health care provider if you or a family member can get training for taking care of the port at home. A home health care nurse may also take care of the port.  Make sure to remember what type of port you have. Incision care  Follow instructions from your health care provider about how to take care of your port insertion site. Make sure you: ? Wash your hands with soap and water before you change your bandage (dressing). If soap and water are not available, use hand sanitizer. ? Change your dressing as told by your health care provider.  You may remove dressing tomorrow 12/12/16. ? Leave skin glue in place. These skin closures may need to stay in place for 2 weeks or longer.  DO NOT use EMLA cream for 2 weeks after PORT placement as this cream will remove surgical glue.  Check your port insertion site every day for signs of infection. Check for: ? More redness,  swelling, or pain. ? More fluid or blood. ? Warmth. ? Pus or a bad smell. General instructions  Do not take baths, swim, or use a hot tub until your health care provider approves.  Do not lift anything that is heavier than 10 lb (4.5 kg) for a week, or as told by your health care provider.  Ask your health care provider when it is okay to: ? Return to work or school. ? Resume usual physical activities or sports.  Do not drive for 24 hours if you were given a medicine to help you relax (sedative).  Take over-the-counter and prescription medicines only as told by your health care provider.  Wear a medical alert bracelet in case of an emergency. This will tell any health care providers that you have a port.  Keep all follow-up visits as told by your health care provider. This is important. Contact a health care provider if:  You have a fever or chills.  You have more redness, swelling, or pain around your port  insertion site.  You have more fluid or blood coming from your port insertion site.  Your port insertion site feels warm to the touch.  You have pus or a bad smell coming from the port insertion site. Get help right away if:  You have chest pain or shortness of breath.  You have bleeding from your port that you cannot control. Summary  Take care of the port as told by your health care provider.  Change your dressing as told by your health care provider.  Keep all follow-up visits as told by your health care provider. This information is not intended to replace advice given to you by your health care provider. Make sure you discuss any questions you have with your health care provider. Document Released: 11/13/2012 Document Revised: 12/15/2015 Document Reviewed: 12/15/2015 Elsevier Interactive Patient Education  2017 [employer].

## 2016-12-11 NOTE — H&P (Signed)
Chief Complaint: Patient was seen in consultation today for colon cancer  Referring Physician(s): Dawson,Gretchen W  Supervising Physician: Arne Cleveland  Patient Status: [facility] - Out-pt  History of Present Illness: Cory Robinson is a 71 y.o. male with past medical history of DM, hiatal hernia, cholecystectomy who presented to [facility] with abdominal pain and weight loss.   CT Abdomen Pelvis 11/19/16 showed: 1. Circumferential mass in the sigmoid colon consistent with colorectal carcinoma. Mass locally invasive into the adjacent sigmoid mesocolon. No evidence of colon bowel obstruction at this time. 2. Widespread hepatic metastasis involving the LEFT and RIGHT hepatic lobes. 3. Rounded nodules at the LEFT and RIGHT lung base consistent with metastatic colorectal carcinoma.  IR consulted for liver lesion biopsy and Port-A-Cath placement at the request of Mike Craze, NP.  Patient presents today in his usual state of health. He denies fever, chills, new abdominal pain, nausea, vomiting, urinary frequency/urgency.  He has been NPO.  He does not take blood thinners.   Past Medical History:  Diagnosis Date  . Diabetes mellitus without complication ([place])   . Duodenitis   . Esophagitis   . Hiatal hernia     Past Surgical History:  Procedure Laterality Date  . CHOLECYSTECTOMY    . EYE SURGERY      Allergies: Patient has no known allergies.  Medications: Prior to Admission medications   Medication Sig Start Date End Date Taking? Authorizing Provider  aspirin EC 81 MG tablet Take 81 mg by mouth daily.    [provider]  Bevacizumab (AVASTIN IV) Inject into the vein.    [provider]  dexamethasone (DECADRON) 4 MG tablet Take 2 tablets (8 mg total) by mouth daily. Start the day after chemotherapy for 2 days. Take with food. 12/04/16   Twana First, MD  diphenoxylate-atropine (LOMOTIL) 2.5-0.025 MG tablet Take 1 tablet by mouth 4 (four)  times daily as needed for diarrhea or loose stools. 11/30/16   Holley Bouche, NP  FLUOROURACIL IV Inject into the vein.    [provider]  gemfibrozil (LOPID) 600 MG tablet Take 600 mg by mouth daily.    [provider]  leucovorin in dextrose 5 % 250 mL Inject into the vein once.    [provider]  lidocaine-prilocaine (EMLA) cream Apply to affected area once 12/04/16   Twana First, MD  Multiple Vitamin (MULTIVITAMIN WITH MINERALS) TABS tablet Take 1 tablet by mouth daily. 11/22/16   Johnson, Clanford L, MD  ondansetron (ZOFRAN) 8 MG tablet Take 1 tablet (8 mg total) by mouth 2 (two) times daily as needed for refractory nausea / vomiting. Start on day 3 after chemotherapy. 12/04/16   Twana First, MD  OXALIPLATIN IV Inject into the vein.    [provider]  oxyCODONE (OXY IR/ROXICODONE) 5 MG immediate release tablet Take 1 tablet (5 mg total) by mouth every 4 (four) hours as needed for severe pain. 11/30/16   Holley Bouche, NP  pantoprazole (PROTONIX) 40 MG tablet Take 40 mg by mouth daily.  10/25/16   [provider]  prochlorperazine (COMPAZINE) 10 MG tablet Take 1 tablet (10 mg total) by mouth every 6 (six) hours as needed (Nausea or vomiting). 12/04/16   Twana First, MD  promethazine (PHENERGAN) 12.5 MG tablet Take 12.5 mg by mouth every 6 (six) hours as needed for nausea or vomiting.    [provider]  Sennosides (SENNA) 8.6 MG CAPS Take 1 capsule by mouth daily as needed.  11/21/16   Johnson, Clanford L, MD  vitamin B-12 (CYANOCOBALAMIN) 1000 MCG tablet Take 1,000 mcg by mouth daily.    [provider]     No family history on file.  Social History   Socioeconomic History  . Marital status: Divorced    Spouse name: Not on file  . Number of children: Not on file  . Years of education: Not on file  . Highest education level: Not on file  Social Needs  . Financial resource strain: Not on file  . Food  insecurity - worry: Not on file  . Food insecurity - inability: Not on file  . Transportation needs - medical: Not on file  . Transportation needs - non-medical: Not on file  Occupational History  . Not on file  Tobacco Use  . Smoking status: Current Every Day Smoker    Packs/day: 1.00    Types: Cigarettes  . Smokeless tobacco: Never Used  Substance and Sexual Activity  . Alcohol use: Yes    Comment: daily until 3 months ago  . Drug use: No  . Sexual activity: Not on file  Other Topics Concern  . Not on file  Social History Narrative  . Not on file    Review of Systems  Constitutional: Negative for fatigue and fever.  Respiratory: Negative for cough and shortness of breath.   Cardiovascular: Negative for chest pain.  Gastrointestinal: Negative for abdominal pain.  Musculoskeletal: Negative for back pain.  Psychiatric/Behavioral: Negative for behavioral problems and confusion.    Vital Signs: BP 123/66 (BP Location: Left Arm)   Pulse 77   Temp 98.4 F (36.9 C) (Oral)   Resp 16   SpO2 98%   Physical Exam  Constitutional: He is oriented to person, place, and time. He appears well-developed.  Cardiovascular: Normal rate, regular rhythm and normal heart sounds.  Pulmonary/Chest: Effort normal and breath sounds normal. No respiratory distress.  Abdominal: Soft.  Neurological: He is alert and oriented to person, place, and time.  Skin: Skin is warm and dry.  Psychiatric: He has a normal mood and affect. His behavior is normal. Judgment and thought content normal.  Nursing note and vitals reviewed.   Imaging: Ct Chest W Contrast  Result Date: 11/20/2016 CLINICAL DATA:  Colon cancer with liver mets. EXAM: CT CHEST WITH CONTRAST TECHNIQUE: Multidetector CT imaging of the chest was performed during intravenous contrast administration. CONTRAST:  3mL ISOVUE-300 IOPAMIDOL (ISOVUE-300) INJECTION 61% COMPARISON:  None. FINDINGS: Cardiovascular: Heart size normal. Tiny  pericardial effusion evident. Coronary artery calcification is evident. No thoracic aortic aneurysm. Mediastinum/Nodes: No mediastinal lymphadenopathy. There is no hilar lymphadenopathy. The esophagus has normal imaging features. There is no axillary lymphadenopathy. Lungs/Pleura: 2.2 cm left lower lobe pulmonary nodule is compatible metastatic disease. 12 mm nodule identified right lower lobe on image 95. 8 mm right lower lobe pulmonary nodule is seen on image 68. 5 mm right upper lobe nodule visible on image 27. 4 mm right upper lobe nodule seen on image 63. Dependent atelectasis noted bilaterally. Small right pleural effusion is evident. Upper Abdomen: Multiple large liver metastases. Musculoskeletal: Bone windows reveal no worrisome lytic or sclerotic osseous lesions. IMPRESSION: 1. Multiple bilateral pulmonary nodules consistent with metastatic disease. 2. Coronary artery atherosclerosis. Electronically Signed   By: Misty Stanley M.D.   On: 11/20/2016 16:49   Ct Abdomen Pelvis W Contrast  Result Date: 11/19/2016 CLINICAL DATA:  Loss of appetite.  30 pound weight loss over 6 weeks EXAM: CT ABDOMEN AND  PELVIS WITH CONTRAST TECHNIQUE: Multidetector CT imaging of the abdomen and pelvis was performed using the standard protocol following bolus administration of intravenous contrast. CONTRAST:  23mL ISOVUE-300 IOPAMIDOL (ISOVUE-300) INJECTION 61% COMPARISON:  None. FINDINGS: Lower chest: 21 mm nodule at the LEFT lung base. Similar round lobe 11 mm nodule at the RIGHT lung base. Hepatobiliary: Large round peripherally enhancing hypodense lesions in the liver consistent metastatic disease. Large lesion in the RIGHT hepatic lobe measures 10.3 cm (image 52, series 6). More inferior RIGHT hepatic lobe lesion measures 7.3 cm (image 44, series 6). Lesions involve central LEFT hepatic lobe measuring 6.8 cm (image 18, series 3). Less involvement of the lateral LEFT hepatic lobe however there are several smaller lesions  present. Pancreas: Pancreas is normal. No ductal dilatation. No pancreatic inflammation. Spleen: Normal spleen Adrenals/urinary tract: Adrenal glands are normal. LEFT kidney atrophic. RIGHT kidney normal Stomach/Bowel: Stomach, duodenum, small-bowel are normal. Appendix not identified. Ascending and transverse colon are normal. There is a mass lesion in the proximal sigmoid colon measuring 6 cm in length and 4.2 cm in width (image 43, series 6). This circumferential bowel wall thickening is consistent malignancy. No evidence of bowel obstruction proximal to this circumferential mass. The more distal sigmoid colon rectum normal. The rectal mass extends into the mesenteric border of the mesocolon (image 47, series 7) Vascular/Lymphatic: Abdominal aortic normal caliber. Periaortic lymph node LEFT aorta measures 9 mm short axis. Small lymph nodes in the mesocolon are suspicious (image 46, series 9) Reproductive:  prostate is enlarged. Other: No peritoneal metastasis identified. Musculoskeletal: Multiple levels of endplate spurring and joint space narrowing. Endplate lesions are favors Schmorl's nodes. Note clear aggressive osseous lesion. IMPRESSION: 1. Circumferential mass in the sigmoid colon consistent with colorectal carcinoma. Mass locally invasive into the adjacent sigmoid mesocolon. No evidence of colon bowel obstruction at this time. 2. Widespread hepatic metastasis involving the LEFT and RIGHT hepatic lobes. 3. Rounded nodules at the LEFT and RIGHT lung base consistent with metastatic colorectal carcinoma. Findings conveyed toJoe Robinson on 11/19/2016  at16:04. Electronically Signed   By: Suzy Bouchard M.D.   On: 11/19/2016 16:05    Labs:  CBC: Recent Labs    11/19/16 1408 11/20/16 0618 11/21/16 0409 12/11/16 1134  WBC 11.2* 8.5 7.8 10.4  HGB 11.6* 9.9* 10.0* 10.0*  HCT 34.6* 30.3* 30.7* 30.5*  PLT 334 285 282 349    COAGS: Recent Labs    12/11/16 1134  INR 1.15  APTT 31     BMP: Recent Labs    11/19/16 1408 11/20/16 0618 11/21/16 0409  NA 131* 135 135  K 3.4* 3.7 3.8  CL 93* 99* 102  CO2 22 24 22   GLUCOSE 226* 144* 159*  BUN 43* 38* 28*  CALCIUM 9.1 8.5* 8.0*  CREATININE 1.46* 1.29* 1.07  GFRNONAA 47* 55* >60  GFRAA 55* >60 >60    LIVER FUNCTION TESTS: Recent Labs    11/19/16 1408 11/20/16 0618 11/21/16 0409  BILITOT 2.3* 1.4* 1.1  AST 47* 45* 44*  ALT 25 22 20   ALKPHOS 297* 283* 266*  PROT 6.9 6.0* 5.5*  ALBUMIN 2.6* 2.2* 2.0*    TUMOR MARKERS: No results for input(s): AFPTM, CEA, CA199, CHROMGRNA in the last 8760 hours.   Assessment and Plan: Patient with past medical history of colon cancer presents with concern for metastatic disease.  IR consulted for liver lesion biopsy and Port-A-Cath placement at the request of Mike Craze, NP. Case reviewed by Dr. Anselm Pancoast who approves patient  for procedure.  Patient presents today in their usual state of health.  He has been NPO and is not currently on blood thinners.  Risks and benefits discussed with the patient including, but not limited to bleeding, infection, damage to adjacent structures or low yield requiring additional tests. Risks and benefits discussed with the patient including, but not limited to bleeding, infection, pneumothorax, or fibrin sheath development and need for additional procedures. All of the patient's questions were answered, patient is agreeable to proceed. Consent signed and in chart.   Thank you for this interesting consult.  I greatly enjoyed meeting Cory Robinson and look forward to participating in their care.  A copy of this report was sent to the requesting provider on this date.  Electronically Signed: Docia Barrier, PA 12/11/2016, 12:09 PM   I spent a total of  30 Minutes   in face to face in clinical consultation, greater than 50% of which was counseling/coordinating care for colon cancer.

## 2016-12-12 ENCOUNTER — Encounter (HOSPITAL_BASED_OUTPATIENT_CLINIC_OR_DEPARTMENT_OTHER): Payer: Medicare PPO

## 2016-12-12 VITALS — BP 138/69 | HR 74 | Temp 98.3°F | Resp 16 | Ht 67.0 in | Wt 157.4 lb

## 2016-12-12 DIAGNOSIS — C7800 Secondary malignant neoplasm of unspecified lung: Secondary | ICD-10-CM

## 2016-12-12 DIAGNOSIS — Z5111 Encounter for antineoplastic chemotherapy: Secondary | ICD-10-CM

## 2016-12-12 DIAGNOSIS — C787 Secondary malignant neoplasm of liver and intrahepatic bile duct: Secondary | ICD-10-CM

## 2016-12-12 DIAGNOSIS — Z5112 Encounter for antineoplastic immunotherapy: Secondary | ICD-10-CM

## 2016-12-12 DIAGNOSIS — C189 Malignant neoplasm of colon, unspecified: Secondary | ICD-10-CM

## 2016-12-12 DIAGNOSIS — Z23 Encounter for immunization: Secondary | ICD-10-CM | POA: Diagnosis present

## 2016-12-12 DIAGNOSIS — R1084 Generalized abdominal pain: Secondary | ICD-10-CM | POA: Diagnosis present

## 2016-12-12 LAB — CBC WITH DIFFERENTIAL/PLATELET
BASOS ABS: 0 10*3/uL (ref 0.0–0.1)
Basophils Relative: 0 %
Eosinophils Absolute: 0.1 10*3/uL (ref 0.0–0.7)
Eosinophils Relative: 1 %
HCT: 30.8 % — ABNORMAL LOW (ref 39.0–52.0)
Hemoglobin: 10.1 g/dL — ABNORMAL LOW (ref 13.0–17.0)
LYMPHS ABS: 0.7 10*3/uL (ref 0.7–4.0)
LYMPHS PCT: 7 %
MCH: 30.1 pg (ref 26.0–34.0)
MCHC: 32.8 g/dL (ref 30.0–36.0)
MCV: 91.9 fL (ref 78.0–100.0)
Monocytes Absolute: 1 10*3/uL (ref 0.1–1.0)
Monocytes Relative: 10 %
NEUTROS ABS: 8.1 10*3/uL — AB (ref 1.7–7.7)
NEUTROS PCT: 82 %
Platelets: 287 10*3/uL (ref 150–400)
RBC: 3.35 MIL/uL — AB (ref 4.22–5.81)
RDW: 15.2 % (ref 11.5–15.5)
WBC: 9.8 10*3/uL (ref 4.0–10.5)

## 2016-12-12 LAB — COMPREHENSIVE METABOLIC PANEL
ALBUMIN: 2.1 g/dL — AB (ref 3.5–5.0)
ALK PHOS: 523 U/L — AB (ref 38–126)
ALT: 38 U/L (ref 17–63)
ANION GAP: 10 (ref 5–15)
AST: 93 U/L — ABNORMAL HIGH (ref 15–41)
BUN: 22 mg/dL — AB (ref 6–20)
CHLORIDE: 98 mmol/L — AB (ref 101–111)
CO2: 25 mmol/L (ref 22–32)
Calcium: 8.6 mg/dL — ABNORMAL LOW (ref 8.9–10.3)
Creatinine, Ser: 0.69 mg/dL (ref 0.61–1.24)
GFR calc Af Amer: >60 mL/min (ref >60)
GFR calc non Af Amer: >60 mL/min (ref >60)
Glucose, Bld: 173 mg/dL — ABNORMAL HIGH (ref 65–99)
Potassium: 3.8 mmol/L (ref 3.5–5.1)
SODIUM: 133 mmol/L — AB (ref 135–145)
Total Bilirubin: 1.7 mg/dL — ABNORMAL HIGH (ref 0.3–1.2)
Total Protein: 6.1 g/dL — ABNORMAL LOW (ref 6.5–8.1)

## 2016-12-12 LAB — URINALYSIS, DIPSTICK ONLY
Bilirubin Urine: NEGATIVE
GLUCOSE, UA: NEGATIVE mg/dL
Hgb urine dipstick: NEGATIVE
Ketones, ur: 5 mg/dL — AB
LEUKOCYTES UA: NEGATIVE
Nitrite: NEGATIVE
PROTEIN: NEGATIVE mg/dL
SPECIFIC GRAVITY, URINE: 1.019 (ref 1.005–1.030)
pH: 5 (ref 5.0–8.0)

## 2016-12-12 MED ORDER — LEUCOVORIN CALCIUM INJECTION 350 MG
385.0000 mg/m2 | Freq: Once | INTRAMUSCULAR | Status: AC
  Administered 2016-12-12: 700 mg via INTRAVENOUS
  Filled 2016-12-12: qty 35

## 2016-12-12 MED ORDER — DEXAMETHASONE SODIUM PHOSPHATE 10 MG/ML IJ SOLN
INTRAMUSCULAR | Status: AC
  Filled 2016-12-12: qty 1

## 2016-12-12 MED ORDER — FLUOROURACIL CHEMO INJECTION 2.5 GM/50ML
390.0000 mg/m2 | Freq: Once | INTRAVENOUS | Status: AC
  Administered 2016-12-12: 700 mg via INTRAVENOUS
  Filled 2016-12-12: qty 14

## 2016-12-12 MED ORDER — SODIUM CHLORIDE 0.9% FLUSH: 10.0000 mL | INTRAVENOUS | Status: DC | PRN

## 2016-12-12 MED ORDER — SODIUM CHLORIDE 0.9 % IV SOLN
INTRAVENOUS | Status: DC
  Administered 2016-12-12: 10:00:00 via INTRAVENOUS

## 2016-12-12 MED ORDER — DEXAMETHASONE SODIUM PHOSPHATE 10 MG/ML IJ SOLN
10.0000 mg | Freq: Once | INTRAMUSCULAR | Status: AC
  Administered 2016-12-12: 10 mg via INTRAVENOUS

## 2016-12-12 MED ORDER — PALONOSETRON HCL INJECTION 0.25 MG/5ML
0.2500 mg | Freq: Once | INTRAVENOUS | Status: AC
  Administered 2016-12-12: 0.25 mg via INTRAVENOUS

## 2016-12-12 MED ORDER — MISC. DEVICES MISC: 0 refills | Status: AC

## 2016-12-12 MED ORDER — MISC. DEVICES MISC: 11 refills | Status: AC

## 2016-12-12 MED ORDER — OXYCODONE HCL 5 MG PO TABS
ORAL_TABLET | ORAL | Status: AC
  Filled 2016-12-12: qty 1

## 2016-12-12 MED ORDER — OXYCODONE HCL 5 MG PO TABS
5.0000 mg | ORAL_TABLET | Freq: Once | ORAL | Status: AC
  Administered 2016-12-12: 5 mg via ORAL

## 2016-12-12 MED ORDER — DEXTROSE 5 % IV SOLN
Freq: Once | INTRAVENOUS | Status: AC
  Administered 2016-12-12: 11:00:00 via INTRAVENOUS

## 2016-12-12 MED ORDER — SODIUM CHLORIDE 0.9 % IV SOLN
2400.0000 mg/m2 | INTRAVENOUS | Status: DC
  Administered 2016-12-12: 4350 mg via INTRAVENOUS
  Filled 2016-12-12: qty 87

## 2016-12-12 MED ORDER — PALONOSETRON HCL INJECTION 0.25 MG/5ML
INTRAVENOUS | Status: AC
  Filled 2016-12-12: qty 5

## 2016-12-12 MED ORDER — SODIUM CHLORIDE 0.9 % IV SOLN
5.0000 mg/kg | Freq: Once | INTRAVENOUS | Status: AC
  Administered 2016-12-12: 350 mg via INTRAVENOUS
  Filled 2016-12-12: qty 14

## 2016-12-12 MED ORDER — MORPHINE SULFATE ER 15 MG PO TBCR: 15.0000 mg | EXTENDED_RELEASE_TABLET | Freq: Two times a day (BID) | ORAL | 0 refills | Status: DC

## 2016-12-12 MED ORDER — OXALIPLATIN CHEMO INJECTION 100 MG/20ML
82.0000 mg/m2 | Freq: Once | INTRAVENOUS | Status: AC
  Administered 2016-12-12: 150 mg via INTRAVENOUS
  Filled 2016-12-12: qty 20

## 2016-12-12 MED ORDER — SODIUM CHLORIDE 0.9 % IV SOLN: 10.0000 mg | Freq: Once | INTRAVENOUS | Status: DC

## 2016-12-12 NOTE — Progress Notes (Signed)
Labs reviewed with MD. Proceed with treatment.  Patient complaining of pain, pressure in his rectum, stated that this oxycodone IR does not help his pain in his rectum just in his belly. MD notified. Will add MS contin extended release and can use the immediate release pain med for break through pain.   Ambulatory pump teaching done with patient, son and his wife at the bedside.They verbalized understanding.   Treatment given per orders. Patient tolerated it well without problems. Vitals stable and discharged home from clinic via wheelchair. Follow up as scheduled.  Continuous 5FU pump connected per protocol.

## 2016-12-12 NOTE — Patient Instructions (Signed)
[facility] Discharge Instructions for Patients Receiving Chemotherapy   Beginning January 23rd 2017 lab work for the [facility] will be done in the  Main lab at [facility] on 1st floor. If you have a lab appointment with the [facility] please come in thru the  Main Entrance and check in at the main information desk   Today you received the following chemotherapy agents   To help prevent nausea and vomiting after your treatment, we encourage you to take your nausea medication   I  The clinic phone number is [phone]. Office hours are Monday-Friday 8:30am-5:00pm.  BELOW ARE SYMPTOMS THAT SHOULD BE REPORTED IMMEDIATELY:  *FEVER GREATER THAN 101.0 F  *CHILLS WITH OR WITHOUT FEVER  NAUSEA AND VOMITING THAT IS NOT CONTROLLED WITH YOUR NAUSEA MEDICATION  *UNUSUAL SHORTNESS OF BREATH  *UNUSUAL BRUISING OR BLEEDING  TENDERNESS IN MOUTH AND THROAT WITH OR WITHOUT PRESENCE OF ULCERS  *URINARY PROBLEMS  *BOWEL PROBLEMS  UNUSUAL RASH Items with * indicate a potential emergency and should be followed up as soon as possible. If you have an emergency after office hours please contact your primary care physician or go to the nearest emergency department.  Please call the clinic during office hours if you have any questions or concerns.   You may also contact the Patient Navigator at [phone] should you have any questions or need assistance in obtaining follow up care.      Resources For Cancer Patients and their Caregivers ? American Cancer Society: Can assist with transportation, wigs, general needs, runs Look Good Feel Better.        832-740-3362 ? Cancer Care: Provides financial assistance, online support groups, medication/co-pay assistance.  1-800-813-HOPE 530-232-9620) ? [facility] Assists [place] cancer patients and their families through emotional , educational and financial support.  629-121-4566 ? [place]  DSS Where to apply for food stamps, Medicaid and utility assistance. (716)450-5459 ? [facility]: Transportation to medical appointments. 434-531-6373 ? Social Security Administration: May apply for disability if have a Stage IV cancer. [phone] ? [employer], Disability and Transit Services: Assists with nutrition, care and transit needs. [phone]

## 2016-12-14 ENCOUNTER — Encounter (HOSPITAL_COMMUNITY): Payer: Self-pay

## 2016-12-14 ENCOUNTER — Encounter (HOSPITAL_BASED_OUTPATIENT_CLINIC_OR_DEPARTMENT_OTHER): Payer: Medicare PPO

## 2016-12-14 ENCOUNTER — Telehealth (HOSPITAL_COMMUNITY): Payer: Self-pay | Admitting: Adult Health

## 2016-12-14 VITALS — BP 125/65 | HR 77 | Temp 98.6°F | Resp 16

## 2016-12-14 DIAGNOSIS — C787 Secondary malignant neoplasm of liver and intrahepatic bile duct: Secondary | ICD-10-CM | POA: Diagnosis not present

## 2016-12-14 DIAGNOSIS — C189 Malignant neoplasm of colon, unspecified: Secondary | ICD-10-CM | POA: Diagnosis not present

## 2016-12-14 DIAGNOSIS — C7800 Secondary malignant neoplasm of unspecified lung: Secondary | ICD-10-CM

## 2016-12-14 MED ORDER — HEPARIN SOD (PORK) LOCK FLUSH 100 UNIT/ML IV SOLN
500.0000 [IU] | Freq: Once | INTRAVENOUS | Status: AC | PRN
  Administered 2016-12-14: 500 [IU]

## 2016-12-14 MED ORDER — SODIUM CHLORIDE 0.9% FLUSH
10.0000 mL | INTRAVENOUS | Status: DC | PRN
  Administered 2016-12-14: 10 mL
  Filled 2016-12-14: qty 10

## 2016-12-14 NOTE — Progress Notes (Signed)
Cory Robinson returns today for port de access and flush after 46 hr continous infusion of 38fu. Tolerated infusion without problems. Portacath located right chest wall was  deaccessed and flushed with 63ml NS and 500U/70ml Heparin and needle removed intact.  Procedure without incident. Patient tolerated procedure well.  Treatment given per orders. Patient tolerated it well without problems. Vitals stable and discharged home from clinic via wheelchair. Follow up as scheduled.

## 2016-12-14 NOTE — Patient Instructions (Signed)
[place] at [facility] Discharge Instructions  RECOMMENDATIONS MADE BY THE CONSULTANT AND ANY TEST RESULTS WILL BE SENT TO YOUR REFERRING PHYSICIAN.  Continuous 66fu pump disconnected Follow up as scheduled.  Thank you for choosing [place] at [facility] to provide your oncology and hematology care.  To afford each patient quality time with our provider, please arrive at least 15 minutes before your scheduled appointment time.    If you have a lab appointment with the [facility] please come in thru the  Main Entrance and check in at the main information desk  You need to re-schedule your appointment should you arrive 10 or more minutes late.  We strive to give you quality time with our providers, and arriving late affects you and other patients whose appointments are after yours.  Also, if you no show three or more times for appointments you may be dismissed from the clinic at the providers discretion.     Again, thank you for choosing [facility].  Our hope is that these requests will decrease the amount of time that you wait before being seen by our physicians.       _____________________________________________________________  Should you have questions after your visit to [facility], please contact our office at [phone] between the hours of 8:30 a.m. and 4:30 p.m.  Voicemails left after 4:30 p.m. will not be returned until the following business day.  For prescription refill requests, have your pharmacy contact our office.       Resources For Cancer Patients and their Caregivers ? American Cancer Society: Can assist with transportation, wigs, general needs, runs Look Good Feel Better.        571-259-4712 ? Cancer Care: Provides financial assistance, online support groups, medication/co-pay assistance.  1-800-813-HOPE 712-104-7728) ? [facility] Assists [place] cancer  patients and their families through emotional , educational and financial support.  680-701-0575 ? [place] DSS Where to apply for food stamps, Medicaid and utility assistance. 754-606-7139 ? [employer]: Transportation to medical appointments. 503 652 5459 ? Social Security Administration: May apply for disability if have a Stage IV cancer. 239-306-6157 907-572-6776 ? [employer], Disability and Transit Services: Assists with nutrition, care and transit needs. [place] Support Programs: @10RELATIVEDAYS @ > Cancer Support Group  2nd Tuesday of the month 1pm-2pm, Journey Room  > Creative Journey  3rd Tuesday of the month 1130am-1pm, Journey Room  > Look Good Feel Better  1st Wednesday of the month 10am-12 noon, Journey Room (Call [place] to register [phone])

## 2016-12-14 NOTE — Telephone Encounter (Signed)
Spoke with Maudie Mercury at [facility] Pathology.   Foundation One ordered on recent liver biopsy.  Maudie Mercury states it will be sent off today.        Mike Craze, NP [place] [phone]

## 2016-12-15 ENCOUNTER — Telehealth (HOSPITAL_COMMUNITY): Payer: Self-pay

## 2016-12-15 NOTE — Telephone Encounter (Signed)
24 hour Chemo Follow-up call.Pt reports he is doing well. He continues to have some constipation which was addressed 2 days ago. He was going to start taking Miralax this evening.Reminded the pt that the 5FU medication can cause some diarrhea so just be cautious. No other issues voiced. Pt instructed to call for any questions or problems and verbalized understanding

## 2016-12-19 ENCOUNTER — Encounter (HOSPITAL_COMMUNITY): Payer: Medicare PPO | Attending: Oncology

## 2016-12-19 ENCOUNTER — Encounter (HOSPITAL_COMMUNITY): Payer: Medicare PPO | Admitting: Dietician

## 2016-12-19 ENCOUNTER — Encounter (HOSPITAL_COMMUNITY): Payer: Self-pay

## 2016-12-19 ENCOUNTER — Encounter (HOSPITAL_BASED_OUTPATIENT_CLINIC_OR_DEPARTMENT_OTHER): Payer: Medicare PPO | Admitting: Oncology

## 2016-12-19 ENCOUNTER — Other Ambulatory Visit: Payer: Self-pay

## 2016-12-19 VITALS — BP 123/60 | HR 88 | Temp 98.4°F | Resp 16 | Ht 67.0 in | Wt 153.0 lb

## 2016-12-19 DIAGNOSIS — C7801 Secondary malignant neoplasm of right lung: Secondary | ICD-10-CM | POA: Diagnosis not present

## 2016-12-19 DIAGNOSIS — Z23 Encounter for immunization: Secondary | ICD-10-CM | POA: Diagnosis not present

## 2016-12-19 DIAGNOSIS — C787 Secondary malignant neoplasm of liver and intrahepatic bile duct: Secondary | ICD-10-CM

## 2016-12-19 DIAGNOSIS — K5900 Constipation, unspecified: Secondary | ICD-10-CM

## 2016-12-19 DIAGNOSIS — C189 Malignant neoplasm of colon, unspecified: Secondary | ICD-10-CM | POA: Insufficient documentation

## 2016-12-19 DIAGNOSIS — C7802 Secondary malignant neoplasm of left lung: Secondary | ICD-10-CM

## 2016-12-19 DIAGNOSIS — C187 Malignant neoplasm of sigmoid colon: Secondary | ICD-10-CM | POA: Diagnosis not present

## 2016-12-19 LAB — CBC WITH DIFFERENTIAL/PLATELET
Basophils Absolute: 0 10*3/uL (ref 0.0–0.1)
Basophils Relative: 0 %
EOS PCT: 4 %
Eosinophils Absolute: 0.5 10*3/uL (ref 0.0–0.7)
HCT: 34.2 % — ABNORMAL LOW (ref 39.0–52.0)
Hemoglobin: 11.1 g/dL — ABNORMAL LOW (ref 13.0–17.0)
LYMPHS ABS: 1 10*3/uL (ref 0.7–4.0)
LYMPHS PCT: 9 %
MCH: 30 pg (ref 26.0–34.0)
MCHC: 32.5 g/dL (ref 30.0–36.0)
MCV: 92.4 fL (ref 78.0–100.0)
MONO ABS: 0.4 10*3/uL (ref 0.1–1.0)
MONOS PCT: 3 %
Neutro Abs: 9.6 10*3/uL — ABNORMAL HIGH (ref 1.7–7.7)
Neutrophils Relative %: 84 %
PLATELETS: 145 10*3/uL — AB (ref 150–400)
RBC: 3.7 MIL/uL — ABNORMAL LOW (ref 4.22–5.81)
RDW: 15.1 % (ref 11.5–15.5)
WBC: 11.4 10*3/uL — ABNORMAL HIGH (ref 4.0–10.5)

## 2016-12-19 LAB — COMPREHENSIVE METABOLIC PANEL
ALBUMIN: 2.3 g/dL — AB (ref 3.5–5.0)
ALT: 51 U/L (ref 17–63)
AST: 72 U/L — ABNORMAL HIGH (ref 15–41)
Alkaline Phosphatase: 381 U/L — ABNORMAL HIGH (ref 38–126)
Anion gap: 9 (ref 5–15)
BUN: 28 mg/dL — ABNORMAL HIGH (ref 6–20)
CHLORIDE: 95 mmol/L — AB (ref 101–111)
CO2: 28 mmol/L (ref 22–32)
Calcium: 8.4 mg/dL — ABNORMAL LOW (ref 8.9–10.3)
Creatinine, Ser: 0.63 mg/dL (ref 0.61–1.24)
GFR calc Af Amer: >60 mL/min (ref >60)
GFR calc non Af Amer: >60 mL/min (ref >60)
GLUCOSE: 206 mg/dL — AB (ref 65–99)
POTASSIUM: 3.7 mmol/L (ref 3.5–5.1)
Sodium: 132 mmol/L — ABNORMAL LOW (ref 135–145)
Total Bilirubin: 2.3 mg/dL — ABNORMAL HIGH (ref 0.3–1.2)
Total Protein: 5.8 g/dL — ABNORMAL LOW (ref 6.5–8.1)

## 2016-12-19 MED ORDER — HEPARIN SOD (PORK) LOCK FLUSH 100 UNIT/ML IV SOLN
500.0000 [IU] | Freq: Once | INTRAVENOUS | Status: AC
  Administered 2016-12-19: 500 [IU] via INTRAVENOUS

## 2016-12-19 MED ORDER — INFLUENZA VAC SPLIT HIGH-DOSE 0.5 ML IM SUSY
0.5000 mL | PREFILLED_SYRINGE | INTRAMUSCULAR | Status: AC
  Administered 2016-12-19: 0.5 mL via INTRAMUSCULAR
  Filled 2016-12-19: qty 0.5

## 2016-12-19 MED ORDER — SODIUM CHLORIDE 0.9% FLUSH
10.0000 mL | Freq: Once | INTRAVENOUS | Status: AC
  Administered 2016-12-19: 10 mL via INTRAVENOUS

## 2016-12-19 NOTE — Patient Instructions (Signed)
[place] at [facility]  Discharge Instructions:  Your port was flushed today and lab work was drawn from your port.  _______________________________________________________________  Thank you for choosing [place] at [facility] to provide your oncology and hematology care.  To afford each patient quality time with our providers, please arrive at least 15 minutes before your scheduled appointment.  You need to re-schedule your appointment if you arrive 10 or more minutes late.  We strive to give you quality time with our providers, and arriving late affects you and other patients whose appointments are after yours.  Also, if you no show three or more times for appointments you may be dismissed from the clinic.  Again, thank you for choosing [place] at [place] hope is that these requests will allow you access to exceptional care and in a timely manner. _______________________________________________________________  If you have questions after your visit, please contact our office at [phone] between the hours of 8:30 a.m. and 5:00 p.m. Voicemails left after 4:30 p.m. will not be returned until the following business day. _______________________________________________________________  For prescription refill requests, have your pharmacy contact our office. _______________________________________________________________  Recommendations made by the consultant and any test results will be sent to your referring physician. _______________________________________________________________

## 2016-12-19 NOTE — Progress Notes (Signed)
Nutrition Assessment  Reason for Assessment: Consult  ASSESSMENT: 71 y/o male PMHx HTN/HLD. Came to Hospital in [facility] October due to anorexia, weight loss, nausea since June. Work up ultimately revealed circumferential mass in sigmoid colon consistent with colorectal carcinoma as well as widespread hepatic metastases. Is s/p port placement. Receiving Palliative chemotherapy  Patient seen with his son following appt with MD. He says his intake is highly variable and depends on how he is feeling. He has been relying a lot on Boost. Typically, he gets up, takes his medication and will usually not feel like eating a meal, so will drink a Boost. He will try to eat a meal for Lunch. He will then drink another Boost for his dinner. He may eat PB crackers with his Boost.  Other than his supplements, he is drinking unsweet tea and water.   Besides a decreased appetite, he has intermittent constipation and diarrhea. He denies any taste changes, nausea or vomiting.   Today's weight indicates a loss of 4.5 lbs in just 1 week (~2.8% bw). He is still up 12 lbs since he presented to hospital in [facility] October.   His son comments that he had been bringing the patient breakfast on a very regular basis. He feels this is why the patient gained weight. However,  the patient had asked to cut back on this fairly recently. The son thinks this is why the patient is down in weight today. He plans on restarting his habit of bringing the patient breakfast.   Nutrition Focused Physical Exam: Severe Fat wasting of triceps. Mild muscle wasting temporalis. Mild wasting of clavicular musculature, moderate wasting deltoid.   Medications: Avastin, Oxaliplatin, 5FU. MVI with minerals. Supportive Medications: Morphine, compazine, phenergan, zofran, lomotil, decadron, ppi, B12 senna  Labs:  WBC:11.4, Albumin:2.3, Glu:206  Recent Labs  Lab 12/19/16 0844  NA 132*  K 3.7  CL 95*  CO2 28  BUN 28*  CREATININE 0.63  CALCIUM 8.4*   GLUCOSE 206*   Anthropometrics:  Height: 5\' 7"  (170.18 cm) Weight:  153 lbs (69.55 kg) UBW: 170 lbs BMI: 23.96 (healthy wt for ht)  Wt Readings from Last 10 Encounters:  12/19/16 153 lb (69.4 kg)  12/12/16 157 lb 6.4 oz (71.4 kg)  11/30/16 154 lb 6.4 oz (70 kg)  11/19/16 141 lb (64 kg)  Via Care Everywhere: 11/15/15:168 lbs  Estimated Energy Needs Kcals: 2000-2200 (28-31 kcal/kg bw) Protein: 93-107 g Pro (1.3-1.5 g/kg bw) Fluid: >2.1 L fluid (30 ml/kg bw)  NUTRITION DIAGNOSIS:  Increased protein needs r/t to advanced cancer and related treatments AEB the estimated nutritional requirements for this condition.   MALNUTRITION DIAGNOSIS: Insufficient data to diagnose at this time  INTERVENTION:  RD spoke about how loss of appetite is a very common symptom of cancer and side affect of treatment. To combat his reduce appetite, he needs to make the most of each bite and eat very frequently.   We discussed the importance of calorically dense, high protein items. Because pt's appetite is poorer, patient needs to prioritize calorie/protein dense foods. RD went over the "Big Three" of cheese, Peanut butter and eggs which are some of the most common foods that are high in both kcals and protein. He likes all of these.   RD advocated for calorie containing beverages. Milk is a better hydrator than water and can also provide protein/kcals. He says he thought milk would give him constipation. RD does not feel this will occur. Recommended drinking more milk. Additionally, if  he is to drink tea, he should drink sweet tea for extra kcals.   RD explained concept of never eating a food by itself. He should add toppings, condiments, dressings, creams, syrups etc to his meals. Examples given are adding whipped cream, chocolate syrup fruit to pancakes, adding cheese, crackers to soup and adding heaving dressings, nuts, cheese, to her salads. These items are  calorie dense and take up very little space in  the stomach.   RD reccommended protein powder. Slight amounts of Unflavored whey protein powder can be added to liquids: soups, cereal, drinks, hot cereals. It can even be sprinkled on solids items.  The son was worried that the Ensure plus diadding protein powder to the Ensure.   RD reviewed foods that are high in protein and items high in calories. Provided handout titled: "Increasing Calories and Protein"  Son states the patient lives on his own, but is not able to cook much on his own. We discussed appropriate convenience items such as protein bars, hardboiled eggs, cottage cheese, PB crackers etc.   RD discussed Ensure Assistance Program. He is given his first case of Ensure today. He is also given coupons for Boost/Ensure and RD contact information.    MONITORING, EVALUATION, GOAL: Weight, Reported intake  NEXT VISIT: As needed.   Burtis Junes RD, LDN, CNSC Clinical Nutrition Pager: [phone] 12/19/2016 10:03 AM

## 2016-12-19 NOTE — Progress Notes (Signed)
Pt given high dose flu shot in left deltoid. Pt tolerated well. Pt stable and discharged home with family.

## 2016-12-19 NOTE — Progress Notes (Signed)
[place], [place] [address]   CLINIC:  Medical Oncology/Hematology  REASON FOR CONSULTATION: Newly diagnosed metastatic colon cancer    REFERRAL FROM:  [facility] team; pt with recent hospitalization  PRIMARY CARE PROVIDER: Patient, [name] Per      HISTORY OF PRESENT ILLNESS:  Mr. Witts 70 y.o. male presents for initial consultation for newly diagnosed metastatic colon cancer.   Initially presented to [facility] ED on 11/19/16 with generalized weakness, lack of appetite, and 30-lb weight loss over past 6 weeks.  CT abdomen/pelvis on 11/19/16 revealed circumferential mass in the sigmoid colon measuring ~6 cm; mass locally invasive into the adjacent sigmoid mesocolon without evidence of bowel obstruction; also found to have widespread hepatic metastases involving both hepatic lobes, as well as nodules in bilateral lungs consistent with metastatic colorectal cancer.  A dedicated CT chest performed on 11/20/16 demonstrated multiple bilateral pulmonary nodules consistent with metastatic disease.  He was admitted to [facility] for further evaluation.  CEA elevated at 203.7 on 11/19/16.   Oncology consult by Dr. Talbert Cage on 11/20/16 recommended outpatient follow-up for consideration for palliative chemotherapy; Hospice was also discussed with patient as potential option at that time as well.    Patient noted to be DNR during inpatient stay. He was discharged on 11/21/16 to home with oncology outpatient appointment scheduled.      INTERVAL HISTORY:  Mr. Shatswell 71 y.o. male presents for initial outpatient consultation for newly diagnosed metastatic colon cancer.  Here today with his stepson.   Patient received cycle 1 of FOLFOX and avastin on 12/12/16 and tolerated it well. He states he has some mild neuropathy in his fingertips. He has some dysguesia. Otherwise he denies any nausea, vomiting, diarrhea, chest pain, shortness of  breath, abdominal pain,focal weakness. He has had a sore throat for the past 3 days but denies any associated fevers/chills, productive cough. He continues to have constipation. He has occasional pains in his RLQ of his abdomen for which he takes his roxicodone. He states he is only taking his roxicodone once a day.  REVIEW OF SYSTEMS: Review of Systems  Constitutional: Positive for fatigue. Negative for chills and fever.  HENT:   Positive for sore throat.   Eyes: Negative.   Respiratory: Negative for shortness of breath.   Cardiovascular: Negative for leg swelling.  Gastrointestinal: Negative for abdominal pain, diarrhea, nausea and rectal pain.  Endocrine: Negative.   Genitourinary: Negative.    Musculoskeletal: Negative.   Skin: Negative for itching.  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  Hematological: Does not bruise/bleed easily.  Psychiatric/Behavioral: Negative.      PAST MEDICAL & SURGICAL HISTORY:  Past Medical History:  Diagnosis Date  . Cancer [facility])    colon cancer  . Diabetes mellitus without complication ([place])   . Duodenitis   . Dyspnea    increased exertion  . Esophagitis   . Hiatal hernia   . Hypertension    pt states is currently on no medications    Past Surgical History:  Procedure Laterality Date  . CHOLECYSTECTOMY    . EYE SURGERY    . hemrroidectomy    . IR FLUORO GUIDE PORT INSERTION RIGHT  12/11/2016  . IR US GUIDE BX ASP/DRAIN  12/11/2016  . IR US GUIDE VASC ACCESS RIGHT  12/11/2016     SOCIAL HISTORY:  Social History   Socioeconomic History  . Marital status: Divorced  Spouse name: None  . Number of children: None  . Years of education: None  . Highest education level: None  Social Needs  . Financial resource strain: None  . Food insecurity - worry: None  . Food insecurity - inability: None  . Transportation needs - medical: None  . Transportation needs - non-medical: None  Occupational  History  . None  Tobacco Use  . Smoking status: Current Every Day Smoker    Packs/day: 1.00    Years: 55.00    Pack years: 55.00    Types: Cigarettes  . Smokeless tobacco: Never Used  Substance and Sexual Activity  . Alcohol use: Yes    Comment: daily until 3 months ago  . Drug use: No  . Sexual activity: None  Other Topics Concern  . None  Social History Narrative  . None     CURRENT MEDICATIONS:  Current Outpatient Medications on File Prior to Visit  Medication Sig Dispense Refill  . aspirin EC 81 MG tablet Take 81 mg by mouth daily.    . Bevacizumab (AVASTIN IV) Inject into the vein.    Marland Kitchen dexamethasone (DECADRON) 4 MG tablet Take 2 tablets (8 mg total) by mouth daily. Start the day after chemotherapy for 2 days. Take with food. 30 tablet 1  . diphenoxylate-atropine (LOMOTIL) 2.5-0.025 MG tablet Take 1 tablet by mouth 4 (four) times daily as needed for diarrhea or loose stools. 60 tablet 0  . FLUOROURACIL IV Inject into the vein.    Marland Kitchen gemfibrozil (LOPID) 600 MG tablet Take 600 mg by mouth daily.    Marland Kitchen leucovorin in dextrose 5 % 250 mL Inject into the vein once.    . lidocaine-prilocaine (EMLA) cream Apply to affected area once 30 g 3  . Misc. Devices MISC Please provide patient with bath / shower chair 1 each 0  . Misc. Devices MISC Please provide patient with adult incontinence briefs. 180 each 11  . Misc. Devices MISC Please provide patient with incontinence pads 180 each 11  . morphine (MS CONTIN) 15 MG 12 hr tablet Take 1 tablet (15 mg total) every 12 (twelve) hours by mouth. 60 tablet 0  . Multiple Vitamin (MULTIVITAMIN WITH MINERALS) TABS tablet Take 1 tablet by mouth daily.    . ondansetron (ZOFRAN) 8 MG tablet Take 1 tablet (8 mg total) by mouth 2 (two) times daily as needed for refractory nausea / vomiting. Start on day 3 after chemotherapy. 30 tablet 1  . OXALIPLATIN IV Inject into the vein.    Marland Kitchen oxyCODONE (OXY IR/ROXICODONE) 5 MG immediate release tablet Take 1  tablet (5 mg total) by mouth every 4 (four) hours as needed for severe pain. 60 tablet 0  . pantoprazole (PROTONIX) 40 MG tablet Take 40 mg by mouth daily.     . prochlorperazine (COMPAZINE) 10 MG tablet Take 1 tablet (10 mg total) by mouth every 6 (six) hours as needed (Nausea or vomiting). 30 tablet 1  . promethazine (PHENERGAN) 12.5 MG tablet Take 12.5 mg by mouth every 6 (six) hours as needed for nausea or vomiting.    . Sennosides (SENNA) 8.6 MG CAPS Take 1 capsule by mouth daily as needed.  0  . vitamin B-12 (CYANOCOBALAMIN) 1000 MCG tablet Take 1,000 mcg by mouth daily.     No current facility-administered medications on file prior to visit.      ALLERGIES: No Known Allergies   PERFORMANCE STATUS: 1-2 - Symptomatic; requires occasional assistance    PHYSICAL EXAM:  Vitals:   12/19/16 0859  BP: 123/60  Pulse: 88  Resp: 16  Temp: 98.4 F (36.9 C)  SpO2: 100%   Filed Weights   12/19/16 0859  Weight: 153 lb (69.4 kg)    Physical Exam  Constitutional: He is oriented to person, place, and time.  Thin male in no acute distress    HENT:  Head: Normocephalic.  Mouth/Throat: Oropharynx is clear and moist. No oropharyngeal exudate.  Eyes: Conjunctivae are normal. Pupils are equal, round, and reactive to light. No scleral icterus.  Neck: Normal range of motion. Neck supple.  Cardiovascular: Normal rate and regular rhythm.  Pulmonary/Chest: Effort normal and breath sounds normal. No respiratory distress. He has no wheezes.  Abdominal: Soft. Bowel sounds are normal. There is no tenderness.  Musculoskeletal: He exhibits no edema.  Lymphadenopathy:    He has no cervical adenopathy.       Right: No supraclavicular adenopathy present.       Left: No supraclavicular adenopathy present.  Neurological: He is alert and oriented to person, place, and time. No cranial nerve deficit.  Skin: Skin is warm and dry.  Psychiatric: Mood, memory, affect and judgment normal.  Nursing note  and vitals reviewed.    LABORATORY DATA: CBC    Component Value Date/Time   WBC 11.4 (H) 12/19/2016 0844   RBC 3.70 (L) 12/19/2016 0844   HGB 11.1 (L) 12/19/2016 0844   HCT 34.2 (L) 12/19/2016 0844   PLT 145 (L) 12/19/2016 0844   MCV 92.4 12/19/2016 0844   MCH 30.0 12/19/2016 0844   MCHC 32.5 12/19/2016 0844   RDW 15.1 12/19/2016 0844   LYMPHSABS 1.0 12/19/2016 0844   MONOABS 0.4 12/19/2016 0844   EOSABS 0.5 12/19/2016 0844   BASOSABS 0.0 12/19/2016 0844   CMP Latest Ref Rng & Units 12/12/2016 11/21/2016 11/20/2016  Glucose 65 - 99 mg/dL 173(H) 159(H) 144(H)  BUN 6 - 20 mg/dL 22(H) 28(H) 38(H)  Creatinine 0.61 - 1.24 mg/dL 0.69 1.07 1.29(H)  Sodium 135 - 145 mmol/L 133(L) 135 135  Potassium 3.5 - 5.1 mmol/L 3.8 3.8 3.7  Chloride 101 - 111 mmol/L 98(L) 102 99(L)  CO2 22 - 32 mmol/L 25 22 24   Calcium 8.9 - 10.3 mg/dL 8.6(L) 8.0(L) 8.5(L)  Total Protein 6.5 - 8.1 g/dL 6.1(L) 5.5(L) 6.0(L)  Total Bilirubin 0.3 - 1.2 mg/dL 1.7(H) 1.1 1.4(H)  Alkaline Phos 38 - 126 U/L 523(H) 266(H) 283(H)  AST 15 - 41 U/L 93(H) 44(H) 45(H)  ALT 17 - 63 U/L 38 20 22      DIAGNOSTIC IMAGING:  *I have reviewed the below radiologic images and agree with reports as listed below.  CT abd/pelvis: 11/19/16 CLINICAL DATA:  Loss of appetite.  30 pound weight loss over 6 weeks  EXAM: CT ABDOMEN AND PELVIS WITH CONTRAST  TECHNIQUE: Multidetector CT imaging of the abdomen and pelvis was performed using the standard protocol following bolus administration of intravenous contrast.  CONTRAST:  47mL ISOVUE-300 IOPAMIDOL (ISOVUE-300) INJECTION 61%  COMPARISON:  None.  FINDINGS: Lower chest: 21 mm nodule at the LEFT lung base. Similar round lobe 11 mm nodule at the RIGHT lung base.  Hepatobiliary: Large round peripherally enhancing hypodense lesions in the liver consistent metastatic disease. Large lesion in the RIGHT hepatic lobe measures 10.3 cm (image 52, series 6). More inferior RIGHT  hepatic lobe lesion measures 7.3 cm (image 44, series 6). Lesions involve central LEFT hepatic lobe measuring 6.8 cm (image 18, series 3). Less involvement of the  lateral LEFT hepatic lobe however there are several smaller lesions present.  Pancreas: Pancreas is normal. No ductal dilatation. No pancreatic inflammation.  Spleen: Normal spleen  Adrenals/urinary tract: Adrenal glands are normal. LEFT kidney atrophic. RIGHT kidney normal  Stomach/Bowel: Stomach, duodenum, small-bowel are normal. Appendix not identified. Ascending and transverse colon are normal.  There is a mass lesion in the proximal sigmoid colon measuring 6 cm in length and 4.2 cm in width (image 43, series 6). This circumferential bowel wall thickening is consistent malignancy. No evidence of bowel obstruction proximal to this circumferential mass. The more distal sigmoid colon rectum normal.  The rectal mass extends into the mesenteric border of the mesocolon (image 47, series 7)  Vascular/Lymphatic: Abdominal aortic normal caliber. Periaortic lymph node LEFT aorta measures 9 mm short axis. Small lymph nodes in the mesocolon are suspicious (image 46, series 9)  Reproductive:  prostate is enlarged.  Other: No peritoneal metastasis identified.  Musculoskeletal: Multiple levels of endplate spurring and joint space narrowing. Endplate lesions are favors Schmorl's nodes. Note clear aggressive osseous lesion.  IMPRESSION: 1. Circumferential mass in the sigmoid colon consistent with colorectal carcinoma. Mass locally invasive into the adjacent sigmoid mesocolon. No evidence of colon bowel obstruction at this time. 2. Widespread hepatic metastasis involving the LEFT and RIGHT hepatic lobes. 3. Rounded nodules at the LEFT and RIGHT lung base consistent with metastatic colorectal carcinoma. Findings conveyed toJoe Zammitt on 11/19/2016  at16:04.   Electronically Signed   By: Suzy Bouchard  M.D.   On: 11/19/2016 16:05   CT chest: 11/20/16 CLINICAL DATA:  Colon cancer with liver mets.  EXAM: CT CHEST WITH CONTRAST  TECHNIQUE: Multidetector CT imaging of the chest was performed during intravenous contrast administration.  CONTRAST:  49mL ISOVUE-300 IOPAMIDOL (ISOVUE-300) INJECTION 61%  COMPARISON:  None.  FINDINGS: Cardiovascular: Heart size normal. Tiny pericardial effusion evident. Coronary artery calcification is evident. No thoracic aortic aneurysm.  Mediastinum/Nodes: No mediastinal lymphadenopathy. There is no hilar lymphadenopathy. The esophagus has normal imaging features. There is no axillary lymphadenopathy.  Lungs/Pleura: 2.2 cm left lower lobe pulmonary nodule is compatible metastatic disease. 12 mm nodule identified right lower lobe on image 95. 8 mm right lower lobe pulmonary nodule is seen on image 68. 5 mm right upper lobe nodule visible on image 27. 4 mm right upper lobe nodule seen on image 63. Dependent atelectasis noted bilaterally. Small right pleural effusion is evident.  Upper Abdomen: Multiple large liver metastases.  Musculoskeletal: Bone windows reveal no worrisome lytic or sclerotic osseous lesions.  IMPRESSION: 1. Multiple bilateral pulmonary nodules consistent with metastatic disease. 2. Coronary artery atherosclerosis.   Electronically Signed   By: Misty Stanley M.D.   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  CT imaging revealed evidence of sigmoid colon mass with widespread metastatic lesions to bilateral liver lobes and bilateral lungs. Baseline CEA elevated at 203.7 on 11/19/16.  -Tolerated cycle 1 of FOLFOX and avastin well. Proceed with cycle 2 on 12/26/16 as  scheduled.  Constipation: -Recommended patient to take miralax daily and add metamucil.  -Also advised him to maintain hydration as well. -Constipation likely narcotic induced.  Health maintenance: -Flu shot given today.  Nutrition: -Patient meeting with our nutritionist Ovid Curd today for evaluation.  RTC on 12/26/16 for cycle 2 of  chemo. Follow up with cycle 3 of chemo.  Twana First, MD

## 2016-12-19 NOTE — Progress Notes (Signed)
Port accessed for lab work and oncology follow up visit. See MAR.  Site clean and dry with no bruising or swelling noted at site.  Incision site intact with clear liquid glue intact with no drainage or redness noted at site.  Band aid applied.  VSS with discharge and left via wheelchair with family.

## 2016-12-25 ENCOUNTER — Encounter (HOSPITAL_COMMUNITY): Payer: Self-pay

## 2016-12-25 ENCOUNTER — Encounter (HOSPITAL_BASED_OUTPATIENT_CLINIC_OR_DEPARTMENT_OTHER): Payer: Medicare PPO

## 2016-12-25 VITALS — BP 149/79 | HR 76 | Temp 97.6°F | Resp 18 | Wt 151.7 lb

## 2016-12-25 DIAGNOSIS — C7802 Secondary malignant neoplasm of left lung: Secondary | ICD-10-CM | POA: Diagnosis not present

## 2016-12-25 DIAGNOSIS — R1084 Generalized abdominal pain: Secondary | ICD-10-CM

## 2016-12-25 DIAGNOSIS — C7801 Secondary malignant neoplasm of right lung: Secondary | ICD-10-CM | POA: Diagnosis not present

## 2016-12-25 DIAGNOSIS — Z5112 Encounter for antineoplastic immunotherapy: Secondary | ICD-10-CM

## 2016-12-25 DIAGNOSIS — C189 Malignant neoplasm of colon, unspecified: Secondary | ICD-10-CM | POA: Diagnosis not present

## 2016-12-25 DIAGNOSIS — C187 Malignant neoplasm of sigmoid colon: Secondary | ICD-10-CM

## 2016-12-25 DIAGNOSIS — C787 Secondary malignant neoplasm of liver and intrahepatic bile duct: Secondary | ICD-10-CM

## 2016-12-25 DIAGNOSIS — Z5111 Encounter for antineoplastic chemotherapy: Secondary | ICD-10-CM

## 2016-12-25 DIAGNOSIS — C7800 Secondary malignant neoplasm of unspecified lung: Secondary | ICD-10-CM

## 2016-12-25 LAB — CBC WITH DIFFERENTIAL/PLATELET
BASOS PCT: 1 %
Basophils Absolute: 0.1 10*3/uL (ref 0.0–0.1)
EOS ABS: 0.4 10*3/uL (ref 0.0–0.7)
EOS PCT: 8 %
HCT: 31.1 % — ABNORMAL LOW (ref 39.0–52.0)
Hemoglobin: 9.8 g/dL — ABNORMAL LOW (ref 13.0–17.0)
LYMPHS ABS: 1 10*3/uL (ref 0.7–4.0)
Lymphocytes Relative: 21 %
MCH: 29.4 pg (ref 26.0–34.0)
MCHC: 31.5 g/dL (ref 30.0–36.0)
MCV: 93.4 fL (ref 78.0–100.0)
MONO ABS: 0.9 10*3/uL (ref 0.1–1.0)
MONOS PCT: 19 %
NEUTROS PCT: 51 %
Neutro Abs: 2.4 10*3/uL (ref 1.7–7.7)
PLATELETS: 220 10*3/uL (ref 150–400)
RBC: 3.33 MIL/uL — ABNORMAL LOW (ref 4.22–5.81)
RDW: 15.4 % (ref 11.5–15.5)
WBC: 4.7 10*3/uL (ref 4.0–10.5)

## 2016-12-25 LAB — COMPREHENSIVE METABOLIC PANEL
ALK PHOS: 414 U/L — AB (ref 38–126)
ALT: 39 U/L (ref 17–63)
AST: 71 U/L — AB (ref 15–41)
Albumin: 2 g/dL — ABNORMAL LOW (ref 3.5–5.0)
Anion gap: 10 (ref 5–15)
BUN: 20 mg/dL (ref 6–20)
CALCIUM: 8.3 mg/dL — AB (ref 8.9–10.3)
CHLORIDE: 96 mmol/L — AB (ref 101–111)
CO2: 27 mmol/L (ref 22–32)
CREATININE: 0.66 mg/dL (ref 0.61–1.24)
GFR calc non Af Amer: >60 mL/min (ref >60)
GLUCOSE: 210 mg/dL — AB (ref 65–99)
Potassium: 3.6 mmol/L (ref 3.5–5.1)
SODIUM: 133 mmol/L — AB (ref 135–145)
Total Bilirubin: 1.4 mg/dL — ABNORMAL HIGH (ref 0.3–1.2)
Total Protein: 5.8 g/dL — ABNORMAL LOW (ref 6.5–8.1)

## 2016-12-25 LAB — URINALYSIS, DIPSTICK ONLY
BILIRUBIN URINE: NEGATIVE
Glucose, UA: NEGATIVE mg/dL
HGB URINE DIPSTICK: NEGATIVE
KETONES UR: NEGATIVE mg/dL
Leukocytes, UA: NEGATIVE
NITRITE: NEGATIVE
Protein, ur: NEGATIVE mg/dL
SPECIFIC GRAVITY, URINE: 1.02 (ref 1.005–1.030)
pH: 5 (ref 5.0–8.0)

## 2016-12-25 MED ORDER — SODIUM CHLORIDE 0.9 % IV SOLN
5.0000 mg/kg | Freq: Once | INTRAVENOUS | Status: AC
  Administered 2016-12-25: 350 mg via INTRAVENOUS
  Filled 2016-12-25: qty 14

## 2016-12-25 MED ORDER — PALONOSETRON HCL INJECTION 0.25 MG/5ML
0.2500 mg | Freq: Once | INTRAVENOUS | Status: AC
  Administered 2016-12-25: 0.25 mg via INTRAVENOUS

## 2016-12-25 MED ORDER — SODIUM CHLORIDE 0.9% FLUSH
10.0000 mL | INTRAVENOUS | Status: DC | PRN
  Administered 2016-12-25: 10 mL
  Filled 2016-12-25: qty 10

## 2016-12-25 MED ORDER — OXYCODONE HCL 5 MG PO TABS
5.0000 mg | ORAL_TABLET | Freq: Once | ORAL | Status: AC
  Administered 2016-12-25: 5 mg via ORAL
  Filled 2016-12-25: qty 1

## 2016-12-25 MED ORDER — DEXAMETHASONE SODIUM PHOSPHATE 10 MG/ML IJ SOLN
INTRAMUSCULAR | Status: AC
Start: 2016-12-25 — End: ?
  Filled 2016-12-25: qty 1

## 2016-12-25 MED ORDER — OXYCODONE HCL 5 MG PO TABS
ORAL_TABLET | ORAL | 0 refills | Status: AC
Start: 2016-12-25 — End: ?

## 2016-12-25 MED ORDER — DEXTROSE 5 % IV SOLN
Freq: Once | INTRAVENOUS | Status: AC
  Administered 2016-12-25: 11:00:00 via INTRAVENOUS

## 2016-12-25 MED ORDER — PALONOSETRON HCL INJECTION 0.25 MG/5ML
INTRAVENOUS | Status: AC
  Filled 2016-12-25: qty 5

## 2016-12-25 MED ORDER — DEXAMETHASONE SODIUM PHOSPHATE 10 MG/ML IJ SOLN
10.0000 mg | Freq: Once | INTRAMUSCULAR | Status: AC
  Administered 2016-12-25: 10 mg via INTRAVENOUS

## 2016-12-25 MED ORDER — MORPHINE SULFATE ER 15 MG PO TBCR: EXTENDED_RELEASE_TABLET | ORAL | 0 refills | Status: AC

## 2016-12-25 MED ORDER — OXALIPLATIN CHEMO INJECTION 100 MG/20ML
82.5000 mg/m2 | Freq: Once | INTRAVENOUS | Status: AC
  Administered 2016-12-25: 150 mg via INTRAVENOUS
  Filled 2016-12-25: qty 20

## 2016-12-25 MED ORDER — SODIUM CHLORIDE 0.9 % IV SOLN
2400.0000 mg/m2 | INTRAVENOUS | Status: DC
  Administered 2016-12-25: 4350 mg via INTRAVENOUS
  Filled 2016-12-25: qty 87

## 2016-12-25 MED ORDER — FLUOROURACIL CHEMO INJECTION 2.5 GM/50ML
400.0000 mg/m2 | Freq: Once | INTRAVENOUS | Status: AC
  Administered 2016-12-25: 750 mg via INTRAVENOUS
  Filled 2016-12-25: qty 10

## 2016-12-25 MED ORDER — SODIUM CHLORIDE 0.9 % IV SOLN
INTRAVENOUS | Status: DC
  Administered 2016-12-25: 10:00:00 via INTRAVENOUS

## 2016-12-25 MED ORDER — DEXTROSE 5 % IV SOLN
385.0000 mg/m2 | Freq: Once | INTRAVENOUS | Status: AC
  Administered 2016-12-25: 700 mg via INTRAVENOUS
  Filled 2016-12-25: qty 35

## 2016-12-25 MED ORDER — SODIUM CHLORIDE 0.9 % IV SOLN: 10.0000 mg | Freq: Once | INTRAVENOUS | Status: DC

## 2016-12-25 NOTE — Patient Instructions (Signed)
[facility] Discharge Instructions for Patients Receiving Chemotherapy  Today you received the following chemotherapy agents Folfox and avastin today.    If you develop nausea and vomiting that is not controlled by your nausea medication, call the clinic.   BELOW ARE SYMPTOMS THAT SHOULD BE REPORTED IMMEDIATELY:  *FEVER GREATER THAN 100.5 F  *CHILLS WITH OR WITHOUT FEVER  NAUSEA AND VOMITING THAT IS NOT CONTROLLED WITH YOUR NAUSEA MEDICATION  *UNUSUAL SHORTNESS OF BREATH  *UNUSUAL BRUISING OR BLEEDING  TENDERNESS IN MOUTH AND THROAT WITH OR WITHOUT PRESENCE OF ULCERS  *URINARY PROBLEMS  *BOWEL PROBLEMS  UNUSUAL RASH Items with * indicate a potential emergency and should be followed up as soon as possible.  Feel free to call the clinic should you have any questions or concerns. The clinic phone number is [phone].  Please show the Central at check-in to the Emergency Department and triage nurse.

## 2016-12-25 NOTE — Progress Notes (Signed)
To treatment area for chemo and labs.  Bilateral lower leg swelling that started one week before first treatment per patient and family.  PRN constipation and diarrhea and the patient and family are working on a regimen of stool softeners and imodium.  Decreased appetite.  Rates abdominal pain 3 on scale.  Stated prn tingling in fingers but able to use zippers, buttons, and jar lids.  No problems with SOB at home but problems when he is out in town with more movement.  No s/s of distress noted.    Reviewed labs and patient symptoms with Dr. Talbert Cage and ok to treat today.   Patient stated he is getting fair pain relief with his MS Contin and Oxycodone IR during the day but asking if he can have something stronger.  Reviewed with Dr. Talbert Cage and regimen for pain relef and ok to increase the MS Contin to 2 tabs BID and increase the Oxycodone 1 or 2 tabs every 4 hours as needed.  Reviewed with the patient and family with understanding verbalized.  Verbal order for Oxycodone IR 5mg  now for breakthrough pain.  Patient rates pain 4 in abdomen.    Patient rated pain 3 on scale after Oxycodone IR 5mg  by mouth for breakthrough pain and discharge from cancer center.  Tolerated chemotherapy with no complaints voiced.  Port site clean and dry with no bruising or swelling noted at site.  Chemotherapy pump 5FU infusing with no alarms and no complaints voiced from the patient.  VSS with discharge and left via wheelchair with family.  No s/s of distress noted.

## 2016-12-27 ENCOUNTER — Encounter (HOSPITAL_BASED_OUTPATIENT_CLINIC_OR_DEPARTMENT_OTHER): Payer: Medicare PPO

## 2016-12-27 ENCOUNTER — Encounter (HOSPITAL_COMMUNITY): Payer: Self-pay

## 2016-12-27 VITALS — BP 117/63 | HR 86 | Temp 97.6°F | Resp 18 | Wt 151.0 lb

## 2016-12-27 DIAGNOSIS — C189 Malignant neoplasm of colon, unspecified: Secondary | ICD-10-CM | POA: Diagnosis not present

## 2016-12-27 DIAGNOSIS — C787 Secondary malignant neoplasm of liver and intrahepatic bile duct: Secondary | ICD-10-CM | POA: Diagnosis not present

## 2016-12-27 DIAGNOSIS — Z452 Encounter for adjustment and management of vascular access device: Secondary | ICD-10-CM

## 2016-12-27 MED ORDER — SODIUM CHLORIDE 0.9% FLUSH
10.0000 mL | INTRAVENOUS | Status: DC | PRN
  Administered 2016-12-27: 10 mL
  Filled 2016-12-27: qty 10

## 2016-12-27 MED ORDER — HEPARIN SOD (PORK) LOCK FLUSH 100 UNIT/ML IV SOLN
500.0000 [IU] | Freq: Once | INTRAVENOUS | Status: AC | PRN
  Administered 2016-12-27: 500 [IU]

## 2016-12-27 MED ORDER — HEPARIN SOD (PORK) LOCK FLUSH 100 UNIT/ML IV SOLN
INTRAVENOUS | Status: AC
  Filled 2016-12-27: qty 5

## 2016-12-27 NOTE — Patient Instructions (Signed)
[facility] at [facility]  Discharge Instructions:  Your chemotherapy pump was disconnected today.  _______________________________________________________________  Thank you for choosing [facility] at [facility] to provide your oncology and hematology care.  To afford each patient quality time with our providers, please arrive at least 15 minutes before your scheduled appointment.  You need to re-schedule your appointment if you arrive 10 or more minutes late.  We strive to give you quality time with our providers, and arriving late affects you and other patients whose appointments are after yours.  Also, if you no show three or more times for appointments you may be dismissed from the clinic.  Again, thank you for choosing [facility] at [facility]. Our hope is that these requests will allow you access to exceptional care and in a timely manner. _______________________________________________________________  If you have questions after your visit, please contact our office at [phone] between the hours of 8:30 a.m. and 5:00 p.m. Voicemails left after 4:30 p.m. will not be returned until the following business day. _______________________________________________________________  For prescription refill requests, have your pharmacy contact our office. _______________________________________________________________  Recommendations made by the consultant and any test results will be sent to your referring physician. _______________________________________________________________ 

## 2016-12-27 NOTE — Progress Notes (Signed)
Patient for pump disconnect.  Patient complained of tenderness at port site.  Site clean and dry with no bruising or swelling noted.  Incision site clean and dry with liquid glue intact.  No drainage noted at site.  Area pink.  Denied fevers and chills.  Complained of tenderness with touch.  Good blood return noted with flush and denied pain with flush.  Stated his seatbelt rests across the area but he holds the seatbelt away from the port site.   Patient stated he may have pulled on the chemotherapy tubing by accident.  Instructed the patient to call for any changes with port site or continued tenderness with touch.  Reminded the patient of recovery time with port placement and healing time with understanding verbalized.    Stated his abdominal pain has decreased with increasing his pain medication.  Patient drove himself to the barber shop yesterday for a haircut per family due to feeling better.    VSS stable with discharge and left via wheelchair with family.  No other complaints voiced and left with no s/s of distress noted.

## 2017-01-08 ENCOUNTER — Ambulatory Visit (HOSPITAL_COMMUNITY): Payer: Medicare PPO

## 2017-01-09 ENCOUNTER — Other Ambulatory Visit: Payer: Self-pay

## 2017-01-09 ENCOUNTER — Encounter (HOSPITAL_COMMUNITY): Payer: Self-pay

## 2017-01-09 ENCOUNTER — Encounter (HOSPITAL_COMMUNITY): Payer: Medicare PPO | Admitting: Oncology

## 2017-01-09 ENCOUNTER — Encounter (HOSPITAL_COMMUNITY): Payer: Medicare PPO | Attending: Adult Health

## 2017-01-09 VITALS — BP 152/78 | HR 70 | Temp 97.9°F | Resp 18 | Wt 141.4 lb

## 2017-01-09 DIAGNOSIS — C189 Malignant neoplasm of colon, unspecified: Secondary | ICD-10-CM

## 2017-01-09 DIAGNOSIS — C787 Secondary malignant neoplasm of liver and intrahepatic bile duct: Secondary | ICD-10-CM | POA: Diagnosis not present

## 2017-01-09 DIAGNOSIS — R1084 Generalized abdominal pain: Secondary | ICD-10-CM | POA: Insufficient documentation

## 2017-01-09 DIAGNOSIS — C7801 Secondary malignant neoplasm of right lung: Secondary | ICD-10-CM

## 2017-01-09 DIAGNOSIS — K5900 Constipation, unspecified: Secondary | ICD-10-CM | POA: Diagnosis not present

## 2017-01-09 DIAGNOSIS — Z5111 Encounter for antineoplastic chemotherapy: Secondary | ICD-10-CM

## 2017-01-09 DIAGNOSIS — C7802 Secondary malignant neoplasm of left lung: Secondary | ICD-10-CM

## 2017-01-09 DIAGNOSIS — C187 Malignant neoplasm of sigmoid colon: Secondary | ICD-10-CM

## 2017-01-09 DIAGNOSIS — Z23 Encounter for immunization: Secondary | ICD-10-CM | POA: Diagnosis present

## 2017-01-09 DIAGNOSIS — C7800 Secondary malignant neoplasm of unspecified lung: Secondary | ICD-10-CM | POA: Insufficient documentation

## 2017-01-09 DIAGNOSIS — K0889 Other specified disorders of teeth and supporting structures: Secondary | ICD-10-CM | POA: Diagnosis not present

## 2017-01-09 DIAGNOSIS — Z5112 Encounter for antineoplastic immunotherapy: Secondary | ICD-10-CM | POA: Diagnosis not present

## 2017-01-09 LAB — URINALYSIS, DIPSTICK ONLY
BILIRUBIN URINE: NEGATIVE
GLUCOSE, UA: NEGATIVE mg/dL
HGB URINE DIPSTICK: NEGATIVE
Ketones, ur: NEGATIVE mg/dL
Leukocytes, UA: NEGATIVE
Nitrite: NEGATIVE
PH: 6 (ref 5.0–8.0)
Protein, ur: NEGATIVE mg/dL
SPECIFIC GRAVITY, URINE: 1.017 (ref 1.005–1.030)

## 2017-01-09 LAB — CBC WITH DIFFERENTIAL/PLATELET
Basophils Absolute: 0.1 10*3/uL (ref 0.0–0.1)
Basophils Relative: 3 %
EOS ABS: 0.1 10*3/uL (ref 0.0–0.7)
Eosinophils Relative: 4 %
HEMATOCRIT: 32.4 % — AB (ref 39.0–52.0)
HEMOGLOBIN: 10.4 g/dL — AB (ref 13.0–17.0)
LYMPHS ABS: 1 10*3/uL (ref 0.7–4.0)
LYMPHS PCT: 31 %
MCH: 29.7 pg (ref 26.0–34.0)
MCHC: 32.1 g/dL (ref 30.0–36.0)
MCV: 92.6 fL (ref 78.0–100.0)
MONOS PCT: 30 %
Monocytes Absolute: 1 10*3/uL (ref 0.1–1.0)
NEUTROS PCT: 32 %
Neutro Abs: 1.1 10*3/uL — ABNORMAL LOW (ref 1.7–7.7)
Platelets: 225 10*3/uL (ref 150–400)
RBC: 3.5 MIL/uL — AB (ref 4.22–5.81)
RDW: 16 % — ABNORMAL HIGH (ref 11.5–15.5)
WBC: 3.4 10*3/uL — AB (ref 4.0–10.5)

## 2017-01-09 LAB — COMPREHENSIVE METABOLIC PANEL
ALBUMIN: 2.2 g/dL — AB (ref 3.5–5.0)
ALT: 31 U/L (ref 17–63)
ANION GAP: 10 (ref 5–15)
AST: 56 U/L — AB (ref 15–41)
Alkaline Phosphatase: 395 U/L — ABNORMAL HIGH (ref 38–126)
BILIRUBIN TOTAL: 1.3 mg/dL — AB (ref 0.3–1.2)
BUN: 13 mg/dL (ref 6–20)
CHLORIDE: 96 mmol/L — AB (ref 101–111)
CO2: 28 mmol/L (ref 22–32)
CREATININE: 0.68 mg/dL (ref 0.61–1.24)
Calcium: 8.6 mg/dL — ABNORMAL LOW (ref 8.9–10.3)
GFR calc Af Amer: >60 mL/min (ref >60)
GFR calc non Af Amer: >60 mL/min (ref >60)
Glucose, Bld: 163 mg/dL — ABNORMAL HIGH (ref 65–99)
POTASSIUM: 3.6 mmol/L (ref 3.5–5.1)
Sodium: 134 mmol/L — ABNORMAL LOW (ref 135–145)
Total Protein: 5.9 g/dL — ABNORMAL LOW (ref 6.5–8.1)

## 2017-01-09 MED ORDER — DEXAMETHASONE SODIUM PHOSPHATE 10 MG/ML IJ SOLN
INTRAMUSCULAR | Status: AC
  Filled 2017-01-09: qty 1

## 2017-01-09 MED ORDER — DEXAMETHASONE SODIUM PHOSPHATE 10 MG/ML IJ SOLN
10.0000 mg | Freq: Once | INTRAMUSCULAR | Status: AC
  Administered 2017-01-09: 10 mg via INTRAVENOUS

## 2017-01-09 MED ORDER — BEVACIZUMAB CHEMO INJECTION 400 MG/16ML
5.0000 mg/kg | Freq: Once | INTRAVENOUS | Status: AC
  Administered 2017-01-09: 350 mg via INTRAVENOUS
  Filled 2017-01-09: qty 14

## 2017-01-09 MED ORDER — LEUCOVORIN CALCIUM INJECTION 350 MG
385.0000 mg/m2 | Freq: Once | INTRAVENOUS | Status: AC
  Administered 2017-01-09: 700 mg via INTRAVENOUS
  Filled 2017-01-09: qty 35

## 2017-01-09 MED ORDER — SODIUM CHLORIDE 0.9 % IV SOLN: 10.0000 mg | Freq: Once | INTRAVENOUS | Status: DC

## 2017-01-09 MED ORDER — SODIUM CHLORIDE 0.9 % IV SOLN
2400.0000 mg/m2 | INTRAVENOUS | Status: DC
  Administered 2017-01-09: 4350 mg via INTRAVENOUS
  Filled 2017-01-09: qty 87

## 2017-01-09 MED ORDER — DEXTROSE 5 % IV SOLN
Freq: Once | INTRAVENOUS | Status: AC
  Administered 2017-01-09: 10:00:00 via INTRAVENOUS

## 2017-01-09 MED ORDER — SODIUM CHLORIDE 0.9 % IV SOLN
INTRAVENOUS | Status: DC
  Administered 2017-01-09: 14:00:00 via INTRAVENOUS

## 2017-01-09 MED ORDER — PALONOSETRON HCL INJECTION 0.25 MG/5ML
INTRAVENOUS | Status: AC
  Filled 2017-01-09: qty 5

## 2017-01-09 MED ORDER — PALONOSETRON HCL INJECTION 0.25 MG/5ML
0.2500 mg | Freq: Once | INTRAVENOUS | Status: AC
  Administered 2017-01-09: 0.25 mg via INTRAVENOUS

## 2017-01-09 MED ORDER — FLUOROURACIL CHEMO INJECTION 2.5 GM/50ML
400.0000 mg/m2 | Freq: Once | INTRAVENOUS | Status: AC
  Administered 2017-01-09: 750 mg via INTRAVENOUS
  Filled 2017-01-09: qty 15

## 2017-01-09 MED ORDER — OXALIPLATIN CHEMO INJECTION 100 MG/20ML
82.5000 mg/m2 | Freq: Once | INTRAVENOUS | Status: AC
  Administered 2017-01-09: 150 mg via INTRAVENOUS
  Filled 2017-01-09: qty 20

## 2017-01-09 NOTE — Progress Notes (Signed)
[address] [place], [place] [address]   CLINIC:  Medical Oncology/Hematology  REASON FOR CONSULTATION: Newly diagnosed metastatic colon cancer    REFERRAL FROM:  [facility] team; pt with recent hospitalization  PRIMARY CARE PROVIDER: Patient, [name] Per      HISTORY OF PRESENT ILLNESS:  Cory Robinson 71 y.o. male presents for initial consultation for newly diagnosed metastatic colon cancer.   Initially presented to [facility] ED on 11/19/16 with generalized weakness, lack of appetite, and 30-lb weight loss over past 6 weeks.  CT abdomen/pelvis on 11/19/16 revealed circumferential mass in the sigmoid colon measuring ~6 cm; mass locally invasive into the adjacent sigmoid mesocolon without evidence of bowel obstruction; also found to have widespread hepatic metastases involving both hepatic lobes, as well as nodules in bilateral lungs consistent with metastatic colorectal cancer.  A dedicated CT chest performed on 11/20/16 demonstrated multiple bilateral pulmonary nodules consistent with metastatic disease.  He was admitted to [facility] for further evaluation.  CEA elevated at 203.7 on 11/19/16.   Oncology consult by Dr. Talbert Cage on 11/20/16 recommended outpatient follow-up for consideration for palliative chemotherapy; Hospice was also discussed with patient as potential option at that time as well.    Patient noted to be DNR during inpatient stay. He was discharged on 11/21/16 to home with oncology outpatient appointment scheduled.      INTERVAL HISTORY:  Cory Robinson 72 y.o. male presents for initial outpatient consultation for newly diagnosed metastatic colon cancer.  Here today with his stepson.   He states that he has been having a toothache but hasn't had a chance to go to the doctor. He states his leg swelling has improved. Otherwise he denies any nausea, vomiting, diarrhea, chest pain, shortness of breath, abdominal pain,focal  weakness. He states his pain is controlled. He takes MS Contin q12h as prescribed and he states he is only taking his roxicodone once a day.  REVIEW OF SYSTEMS: Review of Systems  Constitutional: Positive for fatigue. Negative for chills and fever.  HENT:   Negative for sore throat.   Eyes: Negative.   Respiratory: Negative for shortness of breath.   Cardiovascular: Negative for leg swelling.  Gastrointestinal: Negative for abdominal pain, diarrhea, nausea and rectal pain.  Endocrine: Negative.   Genitourinary: Negative.    Musculoskeletal: Negative.   Skin: Negative for itching.  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  Hematological: Does not bruise/bleed easily.  Psychiatric/Behavioral: Negative.      PAST MEDICAL & SURGICAL HISTORY:  Past Medical History:  Diagnosis Date  . Cancer [facility])    colon cancer  . Diabetes mellitus without complication ([place])   . Duodenitis   . Dyspnea    increased exertion  . Esophagitis   . Hiatal hernia   . Hypertension    pt states is currently on no medications    Past Surgical History:  Procedure Laterality Date  . CHOLECYSTECTOMY    . EYE SURGERY    . hemrroidectomy    . IR FLUORO GUIDE PORT INSERTION RIGHT  12/11/2016  . IR US GUIDE BX ASP/DRAIN  12/11/2016  . IR US GUIDE VASC ACCESS RIGHT  12/11/2016     SOCIAL HISTORY:  Social History   Socioeconomic History  . Marital status: Divorced    Spouse name: Not on file  . Number of children: Not on file  . Years of education: Not on file  . Highest education level:  Not on file  Social Needs  . Financial resource strain: Not on file  . Food insecurity - worry: Not on file  . Food insecurity - inability: Not on file  . Transportation needs - medical: Not on file  . Transportation needs - non-medical: Not on file  Occupational History  . Not on file  Tobacco Use  . Smoking status: Current Every Day Smoker    Packs/day: 1.00    Years: 55.00     Pack years: 55.00    Types: Cigarettes  . Smokeless tobacco: Never Used  Substance and Sexual Activity  . Alcohol use: Yes    Comment: daily until 3 months ago  . Drug use: No  . Sexual activity: Not on file  Other Topics Concern  . Not on file  Social History Narrative  . Not on file     CURRENT MEDICATIONS:  Current Outpatient Medications on File Prior to Visit  Medication Sig Dispense Refill  . aspirin EC 81 MG tablet Take 81 mg by mouth daily.    . Bevacizumab (AVASTIN IV) Inject into the vein.    Marland Kitchen dexamethasone (DECADRON) 4 MG tablet Take 2 tablets (8 mg total) by mouth daily. Start the day after chemotherapy for 2 days. Take with food. 30 tablet 1  . diphenoxylate-atropine (LOMOTIL) 2.5-0.025 MG tablet Take 1 tablet by mouth 4 (four) times daily as needed for diarrhea or loose stools. 60 tablet 0  . FLUOROURACIL IV Inject into the vein.    Marland Kitchen gemfibrozil (LOPID) 600 MG tablet Take 600 mg by mouth daily.    Marland Kitchen leucovorin in dextrose 5 % 250 mL Inject into the vein once.    . lidocaine-prilocaine (EMLA) cream Apply to affected area once 30 g 3  . Misc. Devices MISC Please provide patient with bath / shower chair 1 each 0  . Misc. Devices MISC Please provide patient with adult incontinence briefs. 180 each 11  . Misc. Devices MISC Please provide patient with incontinence pads 180 each 11  . morphine (MS CONTIN) 15 MG 12 hr tablet Take 1 or 2 tabs by mouth every 12 hours as directed. 90 tablet 0  . Multiple Vitamin (MULTIVITAMIN WITH MINERALS) TABS tablet Take 1 tablet by mouth daily.    . ondansetron (ZOFRAN) 8 MG tablet Take 1 tablet (8 mg total) by mouth 2 (two) times daily as needed for refractory nausea / vomiting. Start on day 3 after chemotherapy. 30 tablet 1  . OXALIPLATIN IV Inject into the vein.    Marland Kitchen oxyCODONE (OXY IR/ROXICODONE) 5 MG immediate release tablet Take 1 or 2 tabs every 4 hours as needed for pain 90 tablet 0  . pantoprazole (PROTONIX) 40 MG tablet Take 40 mg  by mouth daily.     . prochlorperazine (COMPAZINE) 10 MG tablet Take 1 tablet (10 mg total) by mouth every 6 (six) hours as needed (Nausea or vomiting). 30 tablet 1  . promethazine (PHENERGAN) 12.5 MG tablet Take 12.5 mg by mouth every 6 (six) hours as needed for nausea or vomiting.    . Sennosides (SENNA) 8.6 MG CAPS Take 1 capsule by mouth daily as needed.  0  . vitamin B-12 (CYANOCOBALAMIN) 1000 MCG tablet Take 1,000 mcg by mouth daily.     Current Facility-Administered Medications on File Prior to Visit  Medication Dose Route Frequency Provider Last Rate Last Dose  . dexamethasone (DECADRON) 10 mg in sodium chloride 0.9 % 50 mL IVPB  10 mg Intravenous Once Talbert Cage,  Barbaraann Share, MD      . dextrose 5 % solution   Intravenous Once Twana First, MD      . fluorouracil (ADRUCIL) 4,350 mg in sodium chloride 0.9 % 63 mL chemo infusion  2,400 mg/m2 (Treatment Plan Recorded) Intravenous 1 day or 1 dose Twana First, MD      . leucovorin 728 mg in dextrose 5 % 250 mL infusion  400 mg/m2 (Treatment Plan Recorded) Intravenous Once Twana First, MD      . oxaliplatin (ELOXATIN) 155 mg in dextrose 5 % 500 mL chemo infusion  85 mg/m2 (Treatment Plan Recorded) Intravenous Once Twana First, MD      . palonosetron (ALOXI) injection 0.25 mg  0.25 mg Intravenous Once Twana First, MD         ALLERGIES: No Known Allergies   PERFORMANCE STATUS: 1-2 - Symptomatic; requires occasional assistance    PHYSICAL EXAM:  Blood pressure 131/64, pulse 79, respiratory rate 18, temperature 97.6F, O2 sat 100%, weight 141 pounds, height 5 foot 7 inches. Physical Exam  Constitutional: He is oriented to person, place, and time.  Thin male in no acute distress    HENT:  Head: Normocephalic.  Mouth/Throat: Oropharynx is clear and moist. No oropharyngeal exudate.  Eyes: Conjunctivae are normal. Pupils are equal, round, and reactive to light. No scleral icterus.  Neck: Normal range of motion. Neck supple.  Cardiovascular:  Normal rate and regular rhythm.  Pulmonary/Chest: Effort normal and breath sounds normal. No respiratory distress. He has no wheezes.  Abdominal: Soft. Bowel sounds are normal. There is no tenderness.  Musculoskeletal: He exhibits no edema.  Lymphadenopathy:    He has no cervical adenopathy.       Right: No supraclavicular adenopathy present.       Left: No supraclavicular adenopathy present.  Neurological: He is alert and oriented to person, place, and time. No cranial nerve deficit.  Skin: Skin is warm and dry.  Psychiatric: Mood, memory, affect and judgment normal.  Nursing note and vitals reviewed.    LABORATORY DATA: CBC    Component Value Date/Time   WBC 3.4 (L) 01/09/2017 0857   RBC 3.50 (L) 01/09/2017 0857   HGB 10.4 (L) 01/09/2017 0857   HCT 32.4 (L) 01/09/2017 0857   PLT 225 01/09/2017 0857   MCV 92.6 01/09/2017 0857   MCH 29.7 01/09/2017 0857   MCHC 32.1 01/09/2017 0857   RDW 16.0 (H) 01/09/2017 0857   LYMPHSABS 1.0 01/09/2017 0857   MONOABS 1.0 01/09/2017 0857   EOSABS 0.1 01/09/2017 0857   BASOSABS 0.1 01/09/2017 0857   CMP Latest Ref Rng & Units 01/09/2017 12/25/2016 12/19/2016  Glucose 65 - 99 mg/dL 163(H) 210(H) 206(H)  BUN 6 - 20 mg/dL 13 20 28(H)  Creatinine 0.61 - 1.24 mg/dL 0.68 0.66 0.63  Sodium 135 - 145 mmol/L 134(L) 133(L) 132(L)  Potassium 3.5 - 5.1 mmol/L 3.6 3.6 3.7  Chloride 101 - 111 mmol/L 96(L) 96(L) 95(L)  CO2 22 - 32 mmol/L 28 27 28   Calcium 8.9 - 10.3 mg/dL 8.6(L) 8.3(L) 8.4(L)  Total Protein 6.5 - 8.1 g/dL 5.9(L) 5.8(L) 5.8(L)  Total Bilirubin 0.3 - 1.2 mg/dL 1.3(H) 1.4(H) 2.3(H)  Alkaline Phos 38 - 126 U/L 395(H) 414(H) 381(H)  AST 15 - 41 U/L 56(H) 71(H) 72(H)  ALT 17 - 63 U/L 31 39 51      DIAGNOSTIC IMAGING:  *I have reviewed the below radiologic images and agree with reports as listed below.  CT abd/pelvis: 11/19/16 CLINICAL DATA:  Loss of appetite.  30 pound weight loss over 6 weeks  EXAM: CT ABDOMEN AND PELVIS WITH  CONTRAST  TECHNIQUE: Multidetector CT imaging of the abdomen and pelvis was performed using the standard protocol following bolus administration of intravenous contrast.  CONTRAST:  60mL ISOVUE-300 IOPAMIDOL (ISOVUE-300) INJECTION 61%  COMPARISON:  None.  FINDINGS: Lower chest: 21 mm nodule at the LEFT lung base. Similar round lobe 11 mm nodule at the RIGHT lung base.  Hepatobiliary: Large round peripherally enhancing hypodense lesions in the liver consistent metastatic disease. Large lesion in the RIGHT hepatic lobe measures 10.3 cm (image 52, series 6). More inferior RIGHT hepatic lobe lesion measures 7.3 cm (image 44, series 6). Lesions involve central LEFT hepatic lobe measuring 6.8 cm (image 18, series 3). Less involvement of the lateral LEFT hepatic lobe however there are several smaller lesions present.  Pancreas: Pancreas is normal. No ductal dilatation. No pancreatic inflammation.  Spleen: Normal spleen  Adrenals/urinary tract: Adrenal glands are normal. LEFT kidney atrophic. RIGHT kidney normal  Stomach/Bowel: Stomach, duodenum, small-bowel are normal. Appendix not identified. Ascending and transverse colon are normal.  There is a mass lesion in the proximal sigmoid colon measuring 6 cm in length and 4.2 cm in width (image 43, series 6). This circumferential bowel wall thickening is consistent malignancy. No evidence of bowel obstruction proximal to this circumferential mass. The more distal sigmoid colon rectum normal.  The rectal mass extends into the mesenteric border of the mesocolon (image 47, series 7)  Vascular/Lymphatic: Abdominal aortic normal caliber. Periaortic lymph node LEFT aorta measures 9 mm short axis. Small lymph nodes in the mesocolon are suspicious (image 46, series 9)  Reproductive:  prostate is enlarged.  Other: No peritoneal metastasis identified.  Musculoskeletal: Multiple levels of endplate spurring and joint space  narrowing. Endplate lesions are favors Schmorl's nodes. Note clear aggressive osseous lesion.  IMPRESSION: 1. Circumferential mass in the sigmoid colon consistent with colorectal carcinoma. Mass locally invasive into the adjacent sigmoid mesocolon. No evidence of colon bowel obstruction at this time. 2. Widespread hepatic metastasis involving the LEFT and RIGHT hepatic lobes. 3. Rounded nodules at the LEFT and RIGHT lung base consistent with metastatic colorectal carcinoma. Findings conveyed toJoe Zammitt on 11/19/2016  at16:04.   Electronically Signed   By: Suzy Bouchard M.D.   On: 11/19/2016 16:05   CT chest: 11/20/16 CLINICAL DATA:  Colon cancer with liver mets.  EXAM: CT CHEST WITH CONTRAST  TECHNIQUE: Multidetector CT imaging of the chest was performed during intravenous contrast administration.  CONTRAST:  28mL ISOVUE-300 IOPAMIDOL (ISOVUE-300) INJECTION 61%  COMPARISON:  None.  FINDINGS: Cardiovascular: Heart size normal. Tiny pericardial effusion evident. Coronary artery calcification is evident. No thoracic aortic aneurysm.  Mediastinum/Nodes: No mediastinal lymphadenopathy. There is no hilar lymphadenopathy. The esophagus has normal imaging features. There is no axillary lymphadenopathy.  Lungs/Pleura: 2.2 cm left lower lobe pulmonary nodule is compatible metastatic disease. 12 mm nodule identified right lower lobe on image 95. 8 mm right lower lobe pulmonary nodule is seen on image 68. 5 mm right upper lobe nodule visible on image 27. 4 mm right upper lobe nodule seen on image 63. Dependent atelectasis noted bilaterally. Small right pleural effusion is evident.  Upper Abdomen: Multiple large liver metastases.  Musculoskeletal: Bone windows reveal no worrisome lytic or sclerotic osseous lesions.  IMPRESSION: 1. Multiple bilateral pulmonary nodules consistent with metastatic disease. 2. Coronary artery  atherosclerosis.   Electronically Signed   By: Misty Stanley M.D.   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  CT imaging revealed evidence of sigmoid colon mass with widespread metastatic lesions to bilateral liver lobes and bilateral lungs. Baseline CEA elevated at 203.7 on 11/19/16.  -Labs reviewed proceed with cycle 3 of chemo as scheduled. Plan to assess response to treatment by checking a CT C/A/P after cycle 4 of chemo.  Constipation: -Continue miralax daily and metamucil.  -Also advised him to maintain hydration as well. -Constipation likely narcotic induced.  Toothache: -I have advised patient to go see his dentist, if he is to have any procedure, he needs to be cleared by [place] prior to his procedure.  Follow up in 4 weeks with CT C/A/P 1-2 days prior to next visit. Continue chemo q2 weeks.  Twana First, MD

## 2017-01-09 NOTE — Progress Notes (Signed)
All labs reviewed with Dr. Talbert Cage.  MD is aware of [place] of 1100.  Okay to tx today per MD.  Awaiting pt to provide UA specimen per Avastin protocol.  Tolerated infusions w/o adverse reaction.  Alert, in no distress.  VSS.  Discharged via wheelchair in c/o family.

## 2017-01-10 ENCOUNTER — Encounter (HOSPITAL_COMMUNITY): Payer: Medicare PPO

## 2017-01-11 ENCOUNTER — Encounter (HOSPITAL_BASED_OUTPATIENT_CLINIC_OR_DEPARTMENT_OTHER): Payer: Medicare PPO

## 2017-01-11 ENCOUNTER — Encounter (HOSPITAL_COMMUNITY): Payer: Self-pay

## 2017-01-11 ENCOUNTER — Other Ambulatory Visit: Payer: Self-pay

## 2017-01-11 VITALS — BP 138/70 | HR 88 | Temp 97.7°F | Resp 18

## 2017-01-11 DIAGNOSIS — C187 Malignant neoplasm of sigmoid colon: Secondary | ICD-10-CM

## 2017-01-11 DIAGNOSIS — C787 Secondary malignant neoplasm of liver and intrahepatic bile duct: Principal | ICD-10-CM

## 2017-01-11 DIAGNOSIS — C189 Malignant neoplasm of colon, unspecified: Secondary | ICD-10-CM

## 2017-01-11 MED ORDER — HEPARIN SOD (PORK) LOCK FLUSH 100 UNIT/ML IV SOLN
500.0000 [IU] | Freq: Once | INTRAVENOUS | Status: AC | PRN
  Administered 2017-01-11: 500 [IU]
  Filled 2017-01-11: qty 5

## 2017-01-11 MED ORDER — SODIUM CHLORIDE 0.9% FLUSH
10.0000 mL | INTRAVENOUS | Status: DC | PRN
  Administered 2017-01-11: 10 mL
  Filled 2017-01-11: qty 10

## 2017-01-11 NOTE — Progress Notes (Signed)
Cory Robinson returns today for port de access and flush after 46 hr continous infusion of 64fu. Tolerated infusion without problems. Portacath located right chest wall was  deaccessed and flushed with 68ml NS and 500U/90ml Heparin and needle removed intact.  Procedure without incident. Patient tolerated procedure well.   Treatment given per orders. Patient tolerated it well without problems. Vitals stable and discharged home from clinic via wheelchair. Follow up as scheduled.

## 2017-01-11 NOTE — Patient Instructions (Signed)
[facility] at [facility] Discharge Instructions  RECOMMENDATIONS MADE BY THE CONSULTANT AND ANY TEST RESULTS WILL BE SENT TO YOUR REFERRING PHYSICIAN.  Disconnected continuous 5FU pump today.  Follow up as scheduled.  Thank you for choosing [facility] at [facility] to provide your oncology and hematology care.  To afford each patient quality time with our provider, please arrive at least 15 minutes before your scheduled appointment time.    If you have a lab appointment with the Cancer Center please come in thru the  Main Entrance and check in at the main information desk  You need to re-schedule your appointment should you arrive 10 or more minutes late.  We strive to give you quality time with our providers, and arriving late affects you and other patients whose appointments are after yours.  Also, if you no show three or more times for appointments you may be dismissed from the clinic at the providers discretion.     Again, thank you for choosing [facility].  Our hope is that these requests will decrease the amount of time that you wait before being seen by our physicians.       _____________________________________________________________  Should you have questions after your visit to [facility], please contact our office at [phone] between the hours of 8:30 a.m. and 4:30 p.m.  Voicemails left after 4:30 p.m. will not be returned until the following business day.  For prescription refill requests, have your pharmacy contact our office.       Resources For Cancer Patients and their Caregivers ? American Cancer Society: Can assist with transportation, wigs, general needs, runs Look Good Feel Better.        1-888-227-6333 ? Cancer Care: Provides financial assistance, online support groups, medication/co-pay assistance.  1-800-813-HOPE (4673) ? [facility] Assists [place]  cancer patients and their families through emotional , educational and financial support.  336-427-4357 ? [place] DSS Where to apply for food stamps, Medicaid and utility assistance. 336-342-1394 ? [facility]: Transportation to medical appointments. 336-347-2287 ? Social Security Administration: May apply for disability if have a Stage IV cancer. 336-342-7796 1-800-772-1213 ? [place] Aging, Disability and Transit Services: Assists with nutrition, care and transit needs. 336-349-2343  Cancer Center Support Programs: @10RELATIVEDAYS@ > Cancer Support Group  2nd Tuesday of the month 1pm-2pm, Journey Room  > Creative Journey  3rd Tuesday of the month 1130am-1pm, Journey Room  > Look Good Feel Better  1st Wednesday of the month 10am-12 noon, Journey Room (Call American Cancer Society to register 1-800-395-5775)   

## 2017-01-23 ENCOUNTER — Encounter (HOSPITAL_COMMUNITY): Payer: Self-pay

## 2017-01-23 ENCOUNTER — Encounter (HOSPITAL_COMMUNITY): Payer: Medicare PPO | Attending: Oncology

## 2017-01-23 VITALS — BP 158/66 | HR 78 | Temp 97.8°F | Resp 18 | Wt 137.6 lb

## 2017-01-23 DIAGNOSIS — C187 Malignant neoplasm of sigmoid colon: Secondary | ICD-10-CM

## 2017-01-23 DIAGNOSIS — C7801 Secondary malignant neoplasm of right lung: Secondary | ICD-10-CM | POA: Diagnosis not present

## 2017-01-23 DIAGNOSIS — E876 Hypokalemia: Secondary | ICD-10-CM

## 2017-01-23 DIAGNOSIS — C787 Secondary malignant neoplasm of liver and intrahepatic bile duct: Secondary | ICD-10-CM | POA: Insufficient documentation

## 2017-01-23 DIAGNOSIS — C7802 Secondary malignant neoplasm of left lung: Secondary | ICD-10-CM

## 2017-01-23 DIAGNOSIS — C189 Malignant neoplasm of colon, unspecified: Secondary | ICD-10-CM | POA: Diagnosis not present

## 2017-01-23 LAB — CBC WITH DIFFERENTIAL/PLATELET
BASOS ABS: 0.1 10*3/uL (ref 0.0–0.1)
Basophils Relative: 2 %
EOS PCT: 11 %
Eosinophils Absolute: 0.3 10*3/uL (ref 0.0–0.7)
HEMATOCRIT: 33.5 % — AB (ref 39.0–52.0)
Hemoglobin: 10.7 g/dL — ABNORMAL LOW (ref 13.0–17.0)
LYMPHS ABS: 0.8 10*3/uL (ref 0.7–4.0)
LYMPHS PCT: 29 %
MCH: 29.8 pg (ref 26.0–34.0)
MCHC: 31.9 g/dL (ref 30.0–36.0)
MCV: 93.3 fL (ref 78.0–100.0)
MONO ABS: 1 10*3/uL (ref 0.1–1.0)
Monocytes Relative: 33 %
NEUTROS ABS: 0.7 10*3/uL — AB (ref 1.7–7.7)
Neutrophils Relative %: 25 %
PLATELETS: 221 10*3/uL (ref 150–400)
RBC: 3.59 MIL/uL — ABNORMAL LOW (ref 4.22–5.81)
RDW: 17.5 % — AB (ref 11.5–15.5)
WBC: 2.9 10*3/uL — ABNORMAL LOW (ref 4.0–10.5)

## 2017-01-23 LAB — COMPREHENSIVE METABOLIC PANEL
ALBUMIN: 2.2 g/dL — AB (ref 3.5–5.0)
ALT: 25 U/L (ref 17–63)
ANION GAP: 13 (ref 5–15)
AST: 62 U/L — AB (ref 15–41)
Alkaline Phosphatase: 403 U/L — ABNORMAL HIGH (ref 38–126)
BUN: 17 mg/dL (ref 6–20)
CHLORIDE: 93 mmol/L — AB (ref 101–111)
CO2: 30 mmol/L (ref 22–32)
Calcium: 8.5 mg/dL — ABNORMAL LOW (ref 8.9–10.3)
Creatinine, Ser: 0.71 mg/dL (ref 0.61–1.24)
GFR calc Af Amer: >60 mL/min (ref >60)
GFR calc non Af Amer: >60 mL/min (ref >60)
GLUCOSE: 123 mg/dL — AB (ref 65–99)
POTASSIUM: 3.1 mmol/L — AB (ref 3.5–5.1)
SODIUM: 136 mmol/L (ref 135–145)
TOTAL PROTEIN: 5.9 g/dL — AB (ref 6.5–8.1)
Total Bilirubin: 1.4 mg/dL — ABNORMAL HIGH (ref 0.3–1.2)

## 2017-01-23 MED ORDER — POTASSIUM CHLORIDE CRYS ER 20 MEQ PO TBCR
40.0000 meq | EXTENDED_RELEASE_TABLET | Freq: Once | ORAL | Status: AC
  Administered 2017-01-23: 40 meq via ORAL

## 2017-01-23 MED ORDER — SODIUM CHLORIDE 0.9% FLUSH
10.0000 mL | Freq: Once | INTRAVENOUS | Status: AC
  Administered 2017-01-23: 10 mL via INTRAVENOUS

## 2017-01-23 MED ORDER — HEPARIN SOD (PORK) LOCK FLUSH 100 UNIT/ML IV SOLN
INTRAVENOUS | Status: AC
Start: 2017-01-23 — End: 2017-01-23
  Filled 2017-01-23: qty 5

## 2017-01-23 MED ORDER — POTASSIUM CHLORIDE CRYS ER 20 MEQ PO TBCR
EXTENDED_RELEASE_TABLET | ORAL | Status: AC
  Filled 2017-01-23: qty 2

## 2017-01-23 MED ORDER — HEPARIN SOD (PORK) LOCK FLUSH 100 UNIT/ML IV SOLN
500.0000 [IU] | Freq: Once | INTRAVENOUS | Status: AC
  Administered 2017-01-23: 500 [IU] via INTRAVENOUS

## 2017-01-23 NOTE — Progress Notes (Signed)
To treatment area for chemotherapy.  Patient stated increased cold sensitivity with the cold weather.  Reviewed precautions for cold weather.  Problems with constipation and diarrhea.  Uses OTC medications for management.  No changes in fatigue or SOB.  Denied pain today.  Denied fevers and chills.  Family at side.    Reviewed labs ANC 0.7 and Potassium 3.1 with Dr. Talbert Cage.  Move chemo to next week and move CT scan out a week after next treatment.  Verbal order for Kcl 47meq now by mouth. Reviewed neutropenia precautions with the patient and family with understanding verbalized.  Discharged in stable condition with VSS and no s/s of distress noted.

## 2017-01-23 NOTE — Patient Instructions (Signed)
[place] at [facility]  Discharge Instructions:   Neutropenia Neutropenia is a condition that occurs when you have a lower-than-normal level of a type of white blood cell (neutrophil) in your body. Neutrophils are made in the spongy center of large bones (bone marrow) and they fight infections. Neutrophils are your body's main defense against bacterial and fungal infections. The fewer neutrophils you have and the longer your body remains without them, the greater your risk of getting a severe infection. What are the causes? This condition can occur if your body uses up or destroys neutrophils faster than your bone marrow can make them. This problem may happen because of:  Bacterial or fungal infection.  Allergic disorders.  Reactions to some medicines.  Autoimmune disease.  An enlarged spleen.  This condition can also occur if your bone marrow does not produce enough neutrophils. This problem may be caused by:  Cancer.  Cancer treatments, such as radiation or chemotherapy.  Viral infections.  Medicines, such as phenytoin.  Vitamin B12 deficiency.  Diseases of the bone marrow.  Environmental toxins, such as insecticides.  What are the signs or symptoms? This condition does not usually cause symptoms. If symptoms are present, they are usually caused by an underlying infection. Symptoms of an infection may include:  Fever.  Chills.  Swollen glands.  Oral or anal ulcers.  Cough and shortness of breath.  Rash.  Skin infection.  Fatigue.  How is this diagnosed? Your health care provider may suspect neutropenia if you have:  A condition that may cause neutropenia.  Symptoms of infection, especially fever.  Frequent and unusual infections.  You will have a medical history and physical exam. Tests will also be done, such as:  A complete blood count (CBC).  A procedure to collect a sample of bone marrow for examination (bone marrow  biopsy).  A chest X-ray.  A urine culture.  A blood culture.  How is this treated? Treatment depends on the underlying cause and severity of your condition. Mild neutropenia may not require treatment. Treatment may include medicines, such as:  Antibiotic medicine given through an IV tube.  Antiviral medicines.  Antifungal medicines.  A medicine to increase neutrophil production (colony-stimulating factor). You may get this drug through an IV tube or by injection.  Steroids given through an IV tube.  If an underlying condition is causing neutropenia, you may need treatment for that condition. If medicines you are taking are causing neutropenia, your health care provider may have you stop taking those medicines. Follow these instructions at home: Medicines  Take over-the-counter and prescription medicines only as told by your health care provider.  Get a seasonal flu shot (influenza vaccine). Lifestyle  Do not eat unpasteurized foods.Do not eat unwashed raw fruits or vegetables.  Avoid exposure to groups of people or children.  Avoid being around people who are sick.  Avoid being around dirt or dust, such as in construction areas or gardens.  Do not provide direct care for pets. Avoid animal droppings. Do not clean litter boxes and bird cages. Hygiene   Bathe daily.  Clean the area between the genitals and the anus (perineal area) after you urinate or have a bowel movement. If you are male, wipe from front to back.  Brush your teeth with a soft toothbrush before and after meals.  Do not use a razor that has a blade. Use an electric razor to remove hair.  Wash your hands often. Make sure others who  come in contact with you also wash their hands. If soap and water are not available, use hand sanitizer. General instructions  Do not have sex unless your health care provider has approved.  Take actions to avoid cuts and burns. For example: ? Be cautious when you use  knives. Always cut away from yourself. ? Keep knives in protective sheaths or guards when not in use. ? Use oven mitts when you cook with a hot stove, oven, or grill. ? Stand a safe distance away from open fires.  Avoid people who received a vaccine in the past 30 days if that vaccine contained a live version of the germ (live vaccine). You should not get a live vaccine. Common live vaccines are varicella, measles, mumps, and rubella.  Do not share food utensils.  Do not use tampons, enemas, or rectal suppositories unless your health care provider has approved.  Keep all appointments as told by your health care provider. This is important. Contact a health care provider if:  You have a fever.  You have chills or you start to shake.  You have: ? A sore throat. ? A warm, red, or tender area on your skin. ? A cough. ? Frequent or painful urination. ? Vaginal discharge or itching.  You develop: ? Sores in your mouth or anus. ? Swollen lymph nodes. ? Red streaks on the skin. ? A rash.  You feel: ? Nauseous or you vomit. ? Very fatigued. ? Short of breath. This information is not intended to replace advice given to you by your health care provider. Make sure you discuss any questions you have with your health care provider. Document Released: 07/15/2001 Document Revised: 07/01/2015 Document Reviewed: 08/05/2014 Elsevier Interactive Patient Education  2018 [employer].  _______________________________________________________________  Thank you for choosing [place] at [facility] to provide your oncology and hematology care.  To afford each patient quality time with our providers, please arrive at least 15 minutes before your scheduled appointment.  You need to re-schedule your appointment if you arrive 10 or more minutes late.  We strive to give you quality time with our providers, and arriving late affects you and other patients whose appointments are  after yours.  Also, if you no show three or more times for appointments you may be dismissed from the clinic.  Again, thank you for choosing [place] at [place] hope is that these requests will allow you access to exceptional care and in a timely manner. _______________________________________________________________  If you have questions after your visit, please contact our office at [phone] between the hours of 8:30 a.m. and 5:00 p.m. Voicemails left after 4:30 p.m. will not be returned until the following business day. _______________________________________________________________  For prescription refill requests, have your pharmacy contact our office. _______________________________________________________________  Recommendations made by the consultant and any test results will be sent to your referring physician. _______________________________________________________________

## 2017-01-25 ENCOUNTER — Encounter (HOSPITAL_COMMUNITY): Payer: Medicare PPO

## 2017-01-31 ENCOUNTER — Other Ambulatory Visit (HOSPITAL_COMMUNITY): Payer: Self-pay | Admitting: Adult Health

## 2017-01-31 ENCOUNTER — Encounter (HOSPITAL_COMMUNITY): Payer: Self-pay | Admitting: Adult Health

## 2017-01-31 ENCOUNTER — Encounter (HOSPITAL_COMMUNITY): Payer: Self-pay

## 2017-01-31 ENCOUNTER — Encounter: Payer: Self-pay | Admitting: Oncology

## 2017-01-31 ENCOUNTER — Encounter (HOSPITAL_COMMUNITY): Payer: Medicare PPO | Attending: Adult Health

## 2017-01-31 ENCOUNTER — Encounter (HOSPITAL_COMMUNITY): Payer: Medicare PPO

## 2017-01-31 VITALS — BP 147/75 | HR 69 | Temp 97.9°F | Resp 18 | Wt 141.6 lb

## 2017-01-31 DIAGNOSIS — C187 Malignant neoplasm of sigmoid colon: Secondary | ICD-10-CM

## 2017-01-31 DIAGNOSIS — C189 Malignant neoplasm of colon, unspecified: Secondary | ICD-10-CM | POA: Diagnosis not present

## 2017-01-31 DIAGNOSIS — Z5112 Encounter for antineoplastic immunotherapy: Secondary | ICD-10-CM

## 2017-01-31 DIAGNOSIS — E876 Hypokalemia: Secondary | ICD-10-CM

## 2017-01-31 DIAGNOSIS — C7802 Secondary malignant neoplasm of left lung: Secondary | ICD-10-CM

## 2017-01-31 DIAGNOSIS — C7801 Secondary malignant neoplasm of right lung: Secondary | ICD-10-CM

## 2017-01-31 DIAGNOSIS — C787 Secondary malignant neoplasm of liver and intrahepatic bile duct: Secondary | ICD-10-CM

## 2017-01-31 DIAGNOSIS — Z5111 Encounter for antineoplastic chemotherapy: Secondary | ICD-10-CM

## 2017-01-31 LAB — URINALYSIS, DIPSTICK ONLY
Bilirubin Urine: NEGATIVE
Glucose, UA: NEGATIVE mg/dL
HGB URINE DIPSTICK: NEGATIVE
Ketones, ur: NEGATIVE mg/dL
Leukocytes, UA: NEGATIVE
NITRITE: NEGATIVE
Protein, ur: NEGATIVE mg/dL
SPECIFIC GRAVITY, URINE: 1.019 (ref 1.005–1.030)
pH: 5 (ref 5.0–8.0)

## 2017-01-31 LAB — CBC WITH DIFFERENTIAL/PLATELET
Basophils Absolute: 0.1 10*3/uL (ref 0.0–0.1)
Basophils Relative: 1 %
EOS PCT: 0 %
Eosinophils Absolute: 0 10*3/uL (ref 0.0–0.7)
HEMATOCRIT: 36 % — AB (ref 39.0–52.0)
Hemoglobin: 11.4 g/dL — ABNORMAL LOW (ref 13.0–17.0)
Lymphocytes Relative: 10 %
Lymphs Abs: 1.1 10*3/uL (ref 0.7–4.0)
MCH: 30.1 pg (ref 26.0–34.0)
MCHC: 31.7 g/dL (ref 30.0–36.0)
MCV: 95 fL (ref 78.0–100.0)
MONOS PCT: 19 %
Monocytes Absolute: 2.1 10*3/uL — ABNORMAL HIGH (ref 0.1–1.0)
NEUTROS PCT: 70 %
Neutro Abs: 7.8 10*3/uL — ABNORMAL HIGH (ref 1.7–7.7)
Platelets: 177 10*3/uL (ref 150–400)
RBC: 3.79 MIL/uL — AB (ref 4.22–5.81)
RDW: 17.4 % — ABNORMAL HIGH (ref 11.5–15.5)
WBC: 11.1 10*3/uL — AB (ref 4.0–10.5)

## 2017-01-31 LAB — COMPREHENSIVE METABOLIC PANEL
ALT: 20 U/L (ref 17–63)
AST: 59 U/L — AB (ref 15–41)
Albumin: 1.9 g/dL — ABNORMAL LOW (ref 3.5–5.0)
Alkaline Phosphatase: 360 U/L — ABNORMAL HIGH (ref 38–126)
Anion gap: 15 (ref 5–15)
BILIRUBIN TOTAL: 1.5 mg/dL — AB (ref 0.3–1.2)
BUN: 19 mg/dL (ref 6–20)
CHLORIDE: 87 mmol/L — AB (ref 101–111)
CO2: 32 mmol/L (ref 22–32)
CREATININE: 0.86 mg/dL (ref 0.61–1.24)
Calcium: 8.2 mg/dL — ABNORMAL LOW (ref 8.9–10.3)
GFR calc Af Amer: >60 mL/min (ref >60)
Glucose, Bld: 196 mg/dL — ABNORMAL HIGH (ref 65–99)
Potassium: 2.8 mmol/L — ABNORMAL LOW (ref 3.5–5.1)
Sodium: 134 mmol/L — ABNORMAL LOW (ref 135–145)
TOTAL PROTEIN: 6.1 g/dL — AB (ref 6.5–8.1)

## 2017-01-31 MED ORDER — SODIUM CHLORIDE 0.9 % IV SOLN: 10.0000 mg | Freq: Once | INTRAVENOUS | Status: DC

## 2017-01-31 MED ORDER — POTASSIUM CHLORIDE IN NACL 40-0.9 MEQ/L-% IV SOLN
Freq: Once | INTRAVENOUS | Status: AC
  Administered 2017-01-31: 500 mL/h via INTRAVENOUS
  Filled 2017-01-31: qty 1000

## 2017-01-31 MED ORDER — SODIUM CHLORIDE 0.9% FLUSH
10.0000 mL | INTRAVENOUS | Status: DC | PRN
  Administered 2017-01-31: 10 mL
  Filled 2017-01-31: qty 10

## 2017-01-31 MED ORDER — FLUOROURACIL CHEMO INJECTION 500 MG/10ML
500.0000 mg | Freq: Once | INTRAVENOUS | Status: AC
  Administered 2017-01-31: 500 mg via INTRAVENOUS
  Filled 2017-01-31: qty 10

## 2017-01-31 MED ORDER — POTASSIUM CHLORIDE CRYS ER 20 MEQ PO TBCR
40.0000 meq | EXTENDED_RELEASE_TABLET | Freq: Once | ORAL | Status: AC
  Administered 2017-01-31: 40 meq via ORAL
  Filled 2017-01-31: qty 2

## 2017-01-31 MED ORDER — DEXTROSE 5 % IV SOLN
Freq: Once | INTRAVENOUS | Status: AC
  Administered 2017-01-31: 13:00:00 via INTRAVENOUS

## 2017-01-31 MED ORDER — LEUCOVORIN CALCIUM INJECTION 350 MG
700.0000 mg | Freq: Once | INTRAVENOUS | Status: AC
  Administered 2017-01-31: 700 mg via INTRAVENOUS
  Filled 2017-01-31: qty 35

## 2017-01-31 MED ORDER — FLUOROURACIL CHEMO INJECTION 5 GM/100ML
1800.0000 mg/m2 | INTRAVENOUS | Status: DC
  Administered 2017-01-31: 3300 mg via INTRAVENOUS
  Filled 2017-01-31: qty 66

## 2017-01-31 MED ORDER — SODIUM CHLORIDE 0.9 % IV SOLN
300.0000 mg | Freq: Once | INTRAVENOUS | Status: AC
  Administered 2017-01-31: 300 mg via INTRAVENOUS
  Filled 2017-01-31: qty 12

## 2017-01-31 MED ORDER — PALONOSETRON HCL INJECTION 0.25 MG/5ML
0.2500 mg | Freq: Once | INTRAVENOUS | Status: AC
  Administered 2017-01-31: 0.25 mg via INTRAVENOUS
  Filled 2017-01-31: qty 5

## 2017-01-31 MED ORDER — DEXAMETHASONE SODIUM PHOSPHATE 10 MG/ML IJ SOLN
10.0000 mg | Freq: Once | INTRAMUSCULAR | Status: AC
  Administered 2017-01-31: 10 mg via INTRAVENOUS
  Filled 2017-01-31: qty 1

## 2017-01-31 MED ORDER — OXALIPLATIN CHEMO INJECTION 100 MG/20ML
150.0000 mg | Freq: Once | INTRAVENOUS | Status: AC
  Administered 2017-01-31: 150 mg via INTRAVENOUS
  Filled 2017-01-31: qty 10

## 2017-01-31 MED ORDER — POTASSIUM CHLORIDE 10 MEQ/100ML IV SOLN
10.0000 meq | INTRAVENOUS | Status: DC
Start: 2017-01-31 — End: 2017-01-31

## 2017-01-31 MED ORDER — SODIUM CHLORIDE 0.9 % IV SOLN
INTRAVENOUS | Status: DC
  Administered 2017-01-31: 10:00:00 via INTRAVENOUS

## 2017-01-31 MED ORDER — POTASSIUM CHLORIDE CRYS ER 20 MEQ PO TBCR: 20.0000 meq | EXTENDED_RELEASE_TABLET | Freq: Two times a day (BID) | ORAL | 1 refills | Status: AC

## 2017-01-31 NOTE — Progress Notes (Signed)
Briefly saw Cory Robinson today prior to his treatment.  He tells me he has been having significant diarrhea "since my last treatment."  State that he had about 4 days of profuse diarrhea, then it improved some with use of Lomotil.  "Then I had more diarrhea."  Denies any fever. "I stay cold all the time."    He spends most of his time in a chair during the day.  His family is with him at the bedside today and states that he was up for ~10 hours yesterday for Christmas. He feels very tired, but otherwise he feels "pretty good."    Based on his symptoms of progressive diarrhea and fatigue, discussed with him that we will dose-reduce his 5-FU by 25% (both the bolus and continuous infusion).  He agrees with this plan.  Treatment plan modified accordingly.   Will replete his low potassium with 40 mEq IV KCl, as well as 40 mEq po today.  Will also start him on K-Dur prescription at home (20 mEq po BID).     Mike Craze, NP [place] [phone]

## 2017-01-31 NOTE — Progress Notes (Signed)
Patient to treatment area for treatment. Last week ANC 0.7 and held.  Patient stated on-going right lower rib/upper abdomen "gas" pain that is constant and more bothersome with certain movements.  PRN diarrhea and last one this morning.  Uses OTC with relief.  Had more loose stools last week with no treatment per patient.  Stated the OTC does help when he has to take medications for relief.    Labs reviewed with Mike Craze, NP, and patient to receive Potassium 50meq IV and 20meq PO today with treatment.  Pharmacy made aware.   Patient tolerated chemotherapy with no complaints voiced.  Port site clean and dry with no bruising or swelling noted at site.  Chemotherapy 5FU pump infusing with no alarms and no complaints voiced by the patient.  VSS with discharge and left via wheelchair with family and no s/s of distress noted.

## 2017-01-31 NOTE — Patient Instructions (Signed)
[facility] Discharge Instructions for Patients Receiving Chemotherapy  Today you received the following chemotherapy agents avastin, oxaliplatin, leucovorin, 5FU and pump.    If you develop nausea and vomiting that is not controlled by your nausea medication, call the clinic.   BELOW ARE SYMPTOMS THAT SHOULD BE REPORTED IMMEDIATELY:  *FEVER GREATER THAN 100.5 F  *CHILLS WITH OR WITHOUT FEVER  NAUSEA AND VOMITING THAT IS NOT CONTROLLED WITH YOUR NAUSEA MEDICATION  *UNUSUAL SHORTNESS OF BREATH  *UNUSUAL BRUISING OR BLEEDING  TENDERNESS IN MOUTH AND THROAT WITH OR WITHOUT PRESENCE OF ULCERS  *URINARY PROBLEMS  *BOWEL PROBLEMS  UNUSUAL RASH Items with * indicate a potential emergency and should be followed up as soon as possible.  Feel free to call the clinic should you have any questions or concerns. The clinic phone number is [phone].  Please show the [place] at check-in to the Emergency Department and triage nurse.

## 2017-01-31 NOTE — Progress Notes (Signed)
Will add-on Neulasta OnPro for this cycle, as last cycle at time of pump removal, his ANC was 700.  OnPro added to treatment plan.   Will see if this medication requires prior authorization from his insurance company; Inbasket message sent to Durene Cal, our patient advocate, to help investigate this further.    Cory Craze, NP [place] [phone]

## 2017-02-02 ENCOUNTER — Encounter (HOSPITAL_BASED_OUTPATIENT_CLINIC_OR_DEPARTMENT_OTHER): Payer: Medicare PPO

## 2017-02-02 ENCOUNTER — Ambulatory Visit (HOSPITAL_COMMUNITY): Payer: Medicare PPO

## 2017-02-02 ENCOUNTER — Other Ambulatory Visit: Payer: Self-pay

## 2017-02-02 ENCOUNTER — Encounter (HOSPITAL_COMMUNITY): Payer: Self-pay

## 2017-02-02 VITALS — BP 119/60 | HR 73 | Temp 98.1°F | Resp 18 | Wt 145.5 lb

## 2017-02-02 DIAGNOSIS — C787 Secondary malignant neoplasm of liver and intrahepatic bile duct: Secondary | ICD-10-CM | POA: Diagnosis not present

## 2017-02-02 DIAGNOSIS — C187 Malignant neoplasm of sigmoid colon: Secondary | ICD-10-CM

## 2017-02-02 DIAGNOSIS — C7801 Secondary malignant neoplasm of right lung: Secondary | ICD-10-CM

## 2017-02-02 DIAGNOSIS — C7802 Secondary malignant neoplasm of left lung: Secondary | ICD-10-CM

## 2017-02-02 DIAGNOSIS — C189 Malignant neoplasm of colon, unspecified: Secondary | ICD-10-CM

## 2017-02-02 MED ORDER — HEPARIN SOD (PORK) LOCK FLUSH 100 UNIT/ML IV SOLN
500.0000 [IU] | Freq: Once | INTRAVENOUS | Status: AC | PRN
  Administered 2017-02-02: 500 [IU]

## 2017-02-02 MED ORDER — PEGFILGRASTIM 6 MG/0.6ML ~~LOC~~ PSKT
6.0000 mg | PREFILLED_SYRINGE | Freq: Once | SUBCUTANEOUS | Status: AC
  Administered 2017-02-02: 6 mg via SUBCUTANEOUS

## 2017-02-02 MED ORDER — SODIUM CHLORIDE 0.9% FLUSH
10.0000 mL | INTRAVENOUS | Status: DC | PRN
  Administered 2017-02-02: 10 mL
  Filled 2017-02-02: qty 10

## 2017-02-02 MED ORDER — PEGFILGRASTIM 6 MG/0.6ML ~~LOC~~ PSKT
PREFILLED_SYRINGE | SUBCUTANEOUS | Status: AC
  Filled 2017-02-02: qty 0.6

## 2017-02-02 NOTE — Progress Notes (Signed)
Cory Robinson presents to have home infusion pump d/c'd and for port-a-cath deaccess with flush. Portacath located right chest wall accessed with  H 20 needle.  Good blood return present. Portacath flushed with NS and 500U/59ml Heparin, and needle removed intact.  Procedure tolerated well and without incident.  Marland KitchenSuann Robinson arrived today for ONPRO neulasta on body injector. See MAR for administration details. Injector in place and engaged with green light indicator on flashing. Tolerated application with out problems. Discharged via wheelchair in c/o family.

## 2017-02-07 ENCOUNTER — Ambulatory Visit (HOSPITAL_COMMUNITY): Payer: Medicare PPO | Admitting: Hematology and Oncology

## 2017-02-07 ENCOUNTER — Ambulatory Visit (HOSPITAL_COMMUNITY): Payer: Medicare PPO

## 2017-02-08 ENCOUNTER — Ambulatory Visit (HOSPITAL_COMMUNITY): Payer: Medicare PPO

## 2017-02-09 ENCOUNTER — Encounter (HOSPITAL_COMMUNITY): Payer: Medicare PPO

## 2017-02-09 ENCOUNTER — Telehealth (HOSPITAL_COMMUNITY): Payer: Self-pay | Admitting: *Deleted

## 2017-02-09 NOTE — Telephone Encounter (Signed)
I tried to call emergency contact Cory Robinson and could not reach her to see how Cory Robinson was doing. Cory Robinson had a fall at home on 02/06/17 and was on the floor a couple of hours per after hours call information. He had a fever of 101, difficulty breathing, was very congested and coughing. EMS was reported on scene in the after hours call information.

## 2017-02-11 ENCOUNTER — Emergency Department (HOSPITAL_COMMUNITY): Payer: Medicare PPO

## 2017-02-11 ENCOUNTER — Encounter (HOSPITAL_COMMUNITY): Payer: Self-pay | Admitting: Cardiology

## 2017-02-11 ENCOUNTER — Other Ambulatory Visit: Payer: Self-pay

## 2017-02-11 ENCOUNTER — Inpatient Hospital Stay (HOSPITAL_COMMUNITY)
Admission: EM | Admit: 2017-02-11 | Discharge: 2017-03-09 | DRG: 871 | Disposition: E | Payer: Medicare PPO | Attending: Internal Medicine | Admitting: Internal Medicine

## 2017-02-11 DIAGNOSIS — Z79899 Other long term (current) drug therapy: Secondary | ICD-10-CM

## 2017-02-11 DIAGNOSIS — Z66 Do not resuscitate: Secondary | ICD-10-CM | POA: Diagnosis present

## 2017-02-11 DIAGNOSIS — Z9221 Personal history of antineoplastic chemotherapy: Secondary | ICD-10-CM

## 2017-02-11 DIAGNOSIS — J189 Pneumonia, unspecified organism: Secondary | ICD-10-CM | POA: Diagnosis present

## 2017-02-11 DIAGNOSIS — Y95 Nosocomial condition: Secondary | ICD-10-CM | POA: Diagnosis present

## 2017-02-11 DIAGNOSIS — Z7189 Other specified counseling: Secondary | ICD-10-CM | POA: Diagnosis not present

## 2017-02-11 DIAGNOSIS — F17210 Nicotine dependence, cigarettes, uncomplicated: Secondary | ICD-10-CM | POA: Diagnosis present

## 2017-02-11 DIAGNOSIS — Z515 Encounter for palliative care: Secondary | ICD-10-CM | POA: Diagnosis not present

## 2017-02-11 DIAGNOSIS — I10 Essential (primary) hypertension: Secondary | ICD-10-CM | POA: Diagnosis present

## 2017-02-11 DIAGNOSIS — E860 Dehydration: Secondary | ICD-10-CM | POA: Diagnosis present

## 2017-02-11 DIAGNOSIS — R1310 Dysphagia, unspecified: Secondary | ICD-10-CM | POA: Diagnosis present

## 2017-02-11 DIAGNOSIS — R778 Other specified abnormalities of plasma proteins: Secondary | ICD-10-CM

## 2017-02-11 DIAGNOSIS — I248 Other forms of acute ischemic heart disease: Secondary | ICD-10-CM | POA: Diagnosis present

## 2017-02-11 DIAGNOSIS — E43 Unspecified severe protein-calorie malnutrition: Secondary | ICD-10-CM | POA: Diagnosis present

## 2017-02-11 DIAGNOSIS — A419 Sepsis, unspecified organism: Principal | ICD-10-CM | POA: Diagnosis present

## 2017-02-11 DIAGNOSIS — R748 Abnormal levels of other serum enzymes: Secondary | ICD-10-CM | POA: Diagnosis not present

## 2017-02-11 DIAGNOSIS — C7800 Secondary malignant neoplasm of unspecified lung: Secondary | ICD-10-CM | POA: Diagnosis present

## 2017-02-11 DIAGNOSIS — Z9049 Acquired absence of other specified parts of digestive tract: Secondary | ICD-10-CM

## 2017-02-11 DIAGNOSIS — J9819 Other pulmonary collapse: Secondary | ICD-10-CM | POA: Diagnosis present

## 2017-02-11 DIAGNOSIS — J690 Pneumonitis due to inhalation of food and vomit: Secondary | ICD-10-CM | POA: Diagnosis present

## 2017-02-11 DIAGNOSIS — R64 Cachexia: Secondary | ICD-10-CM | POA: Diagnosis present

## 2017-02-11 DIAGNOSIS — D899 Disorder involving the immune mechanism, unspecified: Secondary | ICD-10-CM | POA: Diagnosis present

## 2017-02-11 DIAGNOSIS — K449 Diaphragmatic hernia without obstruction or gangrene: Secondary | ICD-10-CM | POA: Diagnosis present

## 2017-02-11 DIAGNOSIS — C189 Malignant neoplasm of colon, unspecified: Secondary | ICD-10-CM | POA: Diagnosis present

## 2017-02-11 DIAGNOSIS — L89322 Pressure ulcer of left buttock, stage 2: Secondary | ICD-10-CM | POA: Diagnosis present

## 2017-02-11 DIAGNOSIS — W19XXXD Unspecified fall, subsequent encounter: Secondary | ICD-10-CM | POA: Diagnosis not present

## 2017-02-11 DIAGNOSIS — W19XXXA Unspecified fall, initial encounter: Secondary | ICD-10-CM | POA: Diagnosis present

## 2017-02-11 DIAGNOSIS — C787 Secondary malignant neoplasm of liver and intrahepatic bile duct: Secondary | ICD-10-CM | POA: Diagnosis present

## 2017-02-11 DIAGNOSIS — G893 Neoplasm related pain (acute) (chronic): Secondary | ICD-10-CM | POA: Diagnosis present

## 2017-02-11 DIAGNOSIS — R197 Diarrhea, unspecified: Secondary | ICD-10-CM | POA: Diagnosis present

## 2017-02-11 DIAGNOSIS — Z7982 Long term (current) use of aspirin: Secondary | ICD-10-CM

## 2017-02-11 DIAGNOSIS — E119 Type 2 diabetes mellitus without complications: Secondary | ICD-10-CM | POA: Diagnosis present

## 2017-02-11 DIAGNOSIS — J850 Gangrene and necrosis of lung: Secondary | ICD-10-CM | POA: Diagnosis present

## 2017-02-11 DIAGNOSIS — Z6821 Body mass index (BMI) 21.0-21.9, adult: Secondary | ICD-10-CM

## 2017-02-11 DIAGNOSIS — R7989 Other specified abnormal findings of blood chemistry: Secondary | ICD-10-CM

## 2017-02-11 LAB — URINALYSIS, ROUTINE W REFLEX MICROSCOPIC
BACTERIA UA: NONE SEEN
Glucose, UA: NEGATIVE mg/dL
HGB URINE DIPSTICK: NEGATIVE
Ketones, ur: NEGATIVE mg/dL
LEUKOCYTES UA: NEGATIVE
Nitrite: NEGATIVE
PROTEIN: 30 mg/dL — AB
Specific Gravity, Urine: 1.02 (ref 1.005–1.030)
pH: 5 (ref 5.0–8.0)

## 2017-02-11 LAB — CBC WITH DIFFERENTIAL/PLATELET
BASOS ABS: 0 10*3/uL (ref 0.0–0.1)
Basophils Relative: 0 %
Eosinophils Absolute: 0 10*3/uL (ref 0.0–0.7)
Eosinophils Relative: 0 %
HEMATOCRIT: 32.7 % — AB (ref 39.0–52.0)
HEMOGLOBIN: 9.9 g/dL — AB (ref 13.0–17.0)
Lymphocytes Relative: 12 %
Lymphs Abs: 1.6 10*3/uL (ref 0.7–4.0)
MCH: 29.6 pg (ref 26.0–34.0)
MCHC: 30.3 g/dL (ref 30.0–36.0)
MCV: 97.6 fL (ref 78.0–100.0)
MONOS PCT: 5 %
Monocytes Absolute: 0.7 10*3/uL (ref 0.1–1.0)
NEUTROS PCT: 83 %
Neutro Abs: 11.3 10*3/uL — ABNORMAL HIGH (ref 1.7–7.7)
Platelets: 203 10*3/uL (ref 150–400)
RBC: 3.35 MIL/uL — AB (ref 4.22–5.81)
RDW: 16.5 % — ABNORMAL HIGH (ref 11.5–15.5)
WBC: 13.6 10*3/uL — AB (ref 4.0–10.5)

## 2017-02-11 LAB — COMPREHENSIVE METABOLIC PANEL
ALBUMIN: 1.5 g/dL — AB (ref 3.5–5.0)
ALK PHOS: 577 U/L — AB (ref 38–126)
ALT: 30 U/L (ref 17–63)
AST: 94 U/L — AB (ref 15–41)
Anion gap: 13 (ref 5–15)
BILIRUBIN TOTAL: 2 mg/dL — AB (ref 0.3–1.2)
BUN: 32 mg/dL — AB (ref 6–20)
CALCIUM: 8.2 mg/dL — AB (ref 8.9–10.3)
CO2: 29 mmol/L (ref 22–32)
Chloride: 100 mmol/L — ABNORMAL LOW (ref 101–111)
Creatinine, Ser: 0.98 mg/dL (ref 0.61–1.24)
GFR calc Af Amer: >60 mL/min (ref >60)
GFR calc non Af Amer: >60 mL/min (ref >60)
GLUCOSE: 108 mg/dL — AB (ref 65–99)
Potassium: 4.1 mmol/L (ref 3.5–5.1)
Sodium: 142 mmol/L (ref 135–145)
TOTAL PROTEIN: 5.2 g/dL — AB (ref 6.5–8.1)

## 2017-02-11 LAB — TROPONIN I: Troponin I: 0.1 ng/mL (ref <0.03)

## 2017-02-11 MED ORDER — PIPERACILLIN-TAZOBACTAM 3.375 G IVPB
3.3750 g | Freq: Three times a day (TID) | INTRAVENOUS | Status: DC
  Administered 2017-02-12 (×2): 3.375 g via INTRAVENOUS
  Filled 2017-02-11 (×2): qty 50

## 2017-02-11 MED ORDER — PANTOPRAZOLE SODIUM 40 MG PO TBEC
40.0000 mg | DELAYED_RELEASE_TABLET | Freq: Every day | ORAL | Status: DC
  Administered 2017-02-11 – 2017-02-12 (×2): 40 mg via ORAL
  Filled 2017-02-11 (×2): qty 1

## 2017-02-11 MED ORDER — GLUCERNA SHAKE PO LIQD
237.0000 mL | Freq: Three times a day (TID) | ORAL | Status: DC
  Administered 2017-02-12: 237 mL via ORAL
  Filled 2017-02-11 (×3): qty 237

## 2017-02-11 MED ORDER — MORPHINE SULFATE ER 15 MG PO TBCR
15.0000 mg | EXTENDED_RELEASE_TABLET | Freq: Two times a day (BID) | ORAL | Status: DC
  Administered 2017-02-11 – 2017-02-12 (×2): 15 mg via ORAL
  Filled 2017-02-11 (×2): qty 1

## 2017-02-11 MED ORDER — GEMFIBROZIL 600 MG PO TABS
600.0000 mg | ORAL_TABLET | Freq: Every day | ORAL | Status: DC
  Administered 2017-02-11 – 2017-02-12 (×2): 600 mg via ORAL
  Filled 2017-02-11 (×2): qty 1

## 2017-02-11 MED ORDER — DEXTROSE 5 % IV SOLN
2.0000 g | Freq: Once | INTRAVENOUS | Status: AC
  Administered 2017-02-11: 2 g via INTRAVENOUS
  Filled 2017-02-11: qty 2

## 2017-02-11 MED ORDER — SODIUM CHLORIDE 0.9 % IV SOLN
INTRAVENOUS | Status: AC
  Administered 2017-02-11: 22:00:00 via INTRAVENOUS

## 2017-02-11 MED ORDER — IOPAMIDOL (ISOVUE-300) INJECTION 61%
75.0000 mL | Freq: Once | INTRAVENOUS | Status: AC | PRN
  Administered 2017-02-11: 75 mL via INTRAVENOUS

## 2017-02-11 MED ORDER — VANCOMYCIN HCL IN DEXTROSE 1-5 GM/200ML-% IV SOLN
1000.0000 mg | Freq: Once | INTRAVENOUS | Status: AC
  Administered 2017-02-11: 1000 mg via INTRAVENOUS
  Filled 2017-02-11: qty 200

## 2017-02-11 MED ORDER — SODIUM CHLORIDE 0.9 % IV SOLN
Freq: Once | INTRAVENOUS | Status: AC
  Administered 2017-02-11: 14:00:00 via INTRAVENOUS

## 2017-02-11 MED ORDER — PIPERACILLIN-TAZOBACTAM 3.375 G IVPB 30 MIN
3.3750 g | Freq: Once | INTRAVENOUS | Status: AC
  Administered 2017-02-11: 3.375 g via INTRAVENOUS
  Filled 2017-02-11: qty 50

## 2017-02-11 MED ORDER — ASPIRIN EC 81 MG PO TBEC
81.0000 mg | DELAYED_RELEASE_TABLET | Freq: Every day | ORAL | Status: DC
  Administered 2017-02-11 – 2017-02-12 (×2): 81 mg via ORAL
  Filled 2017-02-11 (×2): qty 1

## 2017-02-11 MED ORDER — VANCOMYCIN HCL IN DEXTROSE 1-5 GM/200ML-% IV SOLN
1000.0000 mg | INTRAVENOUS | Status: DC
  Administered 2017-02-12: 1000 mg via INTRAVENOUS
  Filled 2017-02-11: qty 200

## 2017-02-11 MED ORDER — ENOXAPARIN SODIUM 40 MG/0.4ML ~~LOC~~ SOLN
40.0000 mg | SUBCUTANEOUS | Status: DC
  Administered 2017-02-11: 40 mg via SUBCUTANEOUS
  Filled 2017-02-11: qty 0.4

## 2017-02-11 MED ORDER — OXYCODONE-ACETAMINOPHEN 5-325 MG PO TABS
1.0000 | ORAL_TABLET | Freq: Once | ORAL | Status: AC
  Administered 2017-02-11: 1 via ORAL
  Filled 2017-02-11: qty 1

## 2017-02-11 MED ORDER — PROMETHAZINE HCL 12.5 MG PO TABS: 12.5000 mg | ORAL_TABLET | Freq: Four times a day (QID) | ORAL | Status: DC | PRN

## 2017-02-11 NOTE — ED Triage Notes (Signed)
Sob,  Productive cough.  Fever since Friday. Per EMS pt also has two  bed sores.  Pt took morphine at home prior to being transported.  RA 92% with EMS.

## 2017-02-11 NOTE — ED Notes (Signed)
Pt's family reporting that it is scheduled time for pt's pain medication. EDP notified.

## 2017-02-11 NOTE — Progress Notes (Signed)
Pharmacy Antibiotic Note  Cory Robinson is a 72 y.o. male admitted on 02/08/2017 with pneumonia.  Pharmacy has been consulted for vancomycin and zosyn dosing. 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  Plan: Vancomycin 1gm IV q24 hours Zosyn 3.375 gm IV q8 hours F/u renal function, cultures and clinical course  Height: 5\' 7"  (170.2 cm) Weight: 145 lb (65.8 kg) IBW/kg (Calculated) : 66.1  Temp (24hrs), Avg:97.3 F (36.3 C), Min:97.3 F (36.3 C), Max:97.3 F (36.3 C)  Recent Labs  Lab 02/24/2017 1335  WBC 13.6*  CREATININE 0.98    Estimated Creatinine Clearance: 65.3 mL/min (by C-G formula based on SCr of 0.98 mg/dL).    No Known Allergies  Antimicrobials this admission: vanc 1/6 >>  zosyn 1/6 >>   Thank you for allowing pharmacy to be a part of this patient's care.  Beverlee Nims 03/08/2017 6:16 PM

## 2017-02-11 NOTE — H&P (Addendum)
History and Physical    Cory Robinson TKW:[id number] DOB: 26-Apr-1945 DOA: 03/04/2017  PCP: Patient, [name] Per   Patient coming from: Home  Chief Complaint: SOB, Cough and fever  HPI: Cory Robinson is a 72 y.o. 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  Most of the history is obtained from his son , patient just received his home pain medication, do a week he is lethargic, not responding to questions . Pt presented via EMS with complaints of cough of at least 1 months duration and SOB of 2 months duration. Reported fever 101, 2 days ago, resolved with tylenol. Pt has also being having diarrhea tat least 2 episodes of watery stools daily for at least 1 month. Reports chronic abdominal and chest pain unchange.  Pt leaves alone and is ambulatory, but until Tuesday -  5 days ago, when he fell off the commode, and was found on the floor.  Since then patient's has been lying in bed, not ambulate and now lives with his son . pt cannot tell me details of what happened, or any symptoms surrounding the fall.  Patient's son reports since Tuesday patient's appetite has been very poor, taking just applesauce and yogurt, with continued watery stools daily but no vomiting. Per Son, Chest pain and abdominal pain are chronic, unchanged.  Patient is currently getting chemo for his stage IV lung cancer, next course is due 02/14/17.  ED Course: Respiratory rate 18-23, O2 sats 92% on room air.  Chest CT with contrast-large right upper lobe necrotic consolidative process likely pneumonia with lung abscess or necrotic pneumonia.  IV vancomycin and IV cefepime started in the ED, given 500 mls normal saline.  Hospitalist called to admit for pneumonia.  Review of Systems: Unable to review due to patient's mental status  Past Medical History:  Diagnosis Date  . Cancer [facility])    colon cancer  . Diabetes mellitus without complication ([place])   . Duodenitis   . Dyspnea    increased exertion  . Esophagitis   . Hiatal hernia   . Hypertension    pt states is currently on no medications     Past Surgical History:  Procedure Laterality Date  . CHOLECYSTECTOMY    . EYE SURGERY    . hemrroidectomy    . IR FLUORO GUIDE PORT INSERTION RIGHT  12/11/2016  . IR US GUIDE BX ASP/DRAIN  12/11/2016  . IR US GUIDE VASC ACCESS RIGHT  12/11/2016     reports that he has been smoking cigarettes.  He has a 55.00 pack-year smoking history. he has never used smokeless tobacco. He reports that he drinks alcohol. He reports that he does not use drugs.  No Known Allergies  History reviewed. No pertinent family history.  Prior to Admission medications   Medication Sig Start Date End Date Taking? Authorizing Provider  aspirin EC 81 MG tablet Take 81 mg by mouth daily.   Yes [provider]  Bevacizumab (AVASTIN IV) Inject into the vein.   Yes [provider]  dexamethasone (DECADRON) 4 MG tablet Take 2 tablets (8 mg total) by mouth daily. Start the day after chemotherapy for 2 days. Take with food. 12/04/16  Yes Twana First, MD  diphenoxylate-atropine (LOMOTIL) 2.5-0.025 MG tablet Take 1 tablet by mouth 4 (four) times daily as needed for diarrhea or loose stools. 11/30/16  Yes Holley Bouche, NP  FLUOROURACIL IV Inject into the vein.   Yes  [provider]  gemfibrozil (LOPID) 600 MG tablet Take 600 mg by mouth daily.   Yes [provider]  leucovorin in dextrose 5 % 250 mL Inject into the vein once.   Yes [provider]  lidocaine-prilocaine (EMLA) cream Apply to affected area once 12/04/16  Yes Twana First, MD  Misc. Devices MISC Please provide patient with bath / shower chair 12/12/16  Yes Twana First, MD  Misc. Devices MISC Please provide patient with adult incontinence briefs. 12/12/16  Yes Twana First, MD  Misc. Devices MISC Please provide patient with incontinence pads 12/12/16  Yes Twana First, MD  morphine (MS CONTIN) 15  MG 12 hr tablet Take 1 or 2 tabs by mouth every 12 hours as directed. 12/25/16  Yes Twana First, MD  Multiple Vitamin (MULTIVITAMIN WITH MINERALS) TABS tablet Take 1 tablet by mouth daily. 11/22/16  Yes Johnson, Clanford L, MD  ondansetron (ZOFRAN) 8 MG tablet Take 1 tablet (8 mg total) by mouth 2 (two) times daily as needed for refractory nausea / vomiting. Start on day 3 after chemotherapy. 12/04/16  Yes Twana First, MD  OXALIPLATIN IV Inject into the vein.   Yes [provider]  oxyCODONE (OXY IR/ROXICODONE) 5 MG immediate release tablet Take 1 or 2 tabs every 4 hours as needed for pain 12/25/16  Yes Twana First, MD  pantoprazole (PROTONIX) 40 MG tablet Take 40 mg by mouth daily.  10/25/16  Yes [provider]  potassium chloride SA (K-DUR,KLOR-CON) 20 MEQ tablet Take 1 tablet (20 mEq total) by mouth 2 (two) times daily. 01/31/17  Yes Holley Bouche, NP  prochlorperazine (COMPAZINE) 10 MG tablet Take 1 tablet (10 mg total) by mouth every 6 (six) hours as needed (Nausea or vomiting). 12/04/16  Yes Twana First, MD  promethazine (PHENERGAN) 12.5 MG tablet Take 12.5 mg by mouth every 6 (six) hours as needed for nausea or vomiting.   Yes [provider]  Sennosides (SENNA) 8.6 MG CAPS Take 1 capsule by mouth daily as needed. 11/21/16  Yes Johnson, Clanford L, MD  vitamin B-12 (CYANOCOBALAMIN) 1000 MCG tablet Take 1,000 mcg by mouth daily.   Yes [provider]    Physical Exam: Limited by patient's mental status lethargy weakness Vitals:   03/06/2017 1630 02/23/2017 1700 02/12/2017 1730 02/07/2017 1800  BP: 101/61 (!) 105/55 109/62 105/65  Pulse: 82 81 80 68  Resp: 18 20 (!) 25 20  Temp:      TempSrc:      SpO2: 93% 95% 91% 90%  Weight:      Height:        Constitutional: NAD, calm, comfortable, frail appearing, weak lethargic Vitals:   02/27/2017 1630 03/08/2017 1700 03/07/2017 1730 02/15/2017 1800  BP: 101/61 (!) 105/55 109/62 105/65  Pulse: 82 81 80 68    Resp: 18 20 (!) 25 20  Temp:      TempSrc:      SpO2: 93% 95% 91% 90%  Weight:      Height:       Eyes: PERRL, lids and conjunctivae normal ENMT: Mucous membranes are dry, Neck: normal, supple Respiratory: Normal respiratory effort. No accessory muscle use.,  No added sounds appreciated. Cardiovascular: Regular rate and rhythm, no murmurs / rubs / gallops. 1+ unchanged chronic bilat pedal edema Abdomen: Minimal diffuse tenderness, no masses palpated. Bowel sounds positive.  Musculoskeletal: no clubbing / cyanosis. No joint deformity upper and lower extremities. Good ROM, no contractures. Normal muscle tone.  Skin: 2  decub ulcers on left buttocks- stage 1 and stage 2. Neurologic: Mental extremities spontaneously Psychiatric: Unable to fully assess  Labs on Admission: I have personally reviewed following labs and imaging studies  CBC: Recent Labs  Lab 03/04/2017 1335  WBC 13.6*  NEUTROABS 11.3*  HGB 9.9*  HCT 32.7*  MCV 97.6  PLT 409   Basic Metabolic Panel: Recent Labs  Lab 02/10/2017 1335  NA 142  K 4.1  CL 100*  CO2 29  GLUCOSE 108*  BUN 32*  CREATININE 0.98  CALCIUM 8.2*   Liver Function Tests: Recent Labs  Lab 02/16/2017 1335  AST 94*  ALT 30  ALKPHOS 577*  BILITOT 2.0*  PROT 5.2*  ALBUMIN 1.5*   Cardiac Enzymes: Recent Labs  Lab 03/03/2017 1335  TROPONINI 0.10*   Urine analysis:    Component Value Date/Time   COLORURINE AMBER (A) 02/15/2017 [place] 02/08/2017 1545   LABSPEC 1.020 02/27/2017 1545   PHURINE 5.0 02/25/2017 1545   GLUCOSEU NEGATIVE 02/17/2017 1545   HGBUR NEGATIVE 02/19/2017 1545   BILIRUBINUR SMALL (A) 02/14/2017 1545   KETONESUR NEGATIVE 02/15/2017 1545   PROTEINUR 30 (A) 02/26/2017 1545   NITRITE NEGATIVE 02/17/2017 1545   LEUKOCYTESUR NEGATIVE 02/17/2017 1545    Radiological Exams on Admission: Ct Chest W Contrast  Result Date: 02/08/2017 CLINICAL DATA:  Shortness of breath and fever EXAM: CT CHEST WITH  CONTRAST TECHNIQUE: Multidetector CT imaging of the chest was performed during intravenous contrast administration. CONTRAST:  53mL ISOVUE-300 IOPAMIDOL (ISOVUE-300) INJECTION 61% COMPARISON:  11/20/2016 FINDINGS: Cardiovascular: Heart size upper normal. No pericardial effusion. Atherosclerotic calcification is noted in the wall of the thoracic aorta. Right Port-A-Cath tip is positioned in the upper right atrium. Mediastinum/Nodes: scattered small mediastinal lymph nodes evident. No gross hilar lymphadenopathy. The esophagus has normal imaging features. There is no axillary lymphadenopathy. Lungs/Pleura: Dense right upper lobe airspace consolidation has central cavitation with air-fluid levels. This is new in the interval. There is right lower lobe collapse/ consolidation with moderate right pleural effusion. 1.7 cm nodule anterior left lower lobe has decreased from 2.2 cm previously. There is left lower lobe collapse/ consolidation with small left pleural effusion. 5 mm left upper lobe pulmonary nodule is similar. Other scattered tiny bilateral pulmonary nodules are evident. Upper Abdomen: Bulky metastatic disease again noted in the liver. There is probably some ascites and edema in the soft tissues the liver but this area is obscured by streak artifact from the patient's arms. Musculoskeletal: Bone windows reveal no worrisome lytic or sclerotic osseous lesions. IMPRESSION: 1. Large right upper lobe necrotic consolidative process, likely pneumonia with lung abscess or necrotic pneumonia. Rapid interval appearance makes metastatic involvement less likely. 2. Bilateral pulmonary nodules with slight decrease in size of the dominant left lower lobe pulmonary nodule described on the prior study. 3. Bilateral lower lobe collapse/consolidation with small to moderate right and small left pleural effusions. 4. Bulky hepatic metastases. 5. Probable ascites in the upper abdomen although upper abdomen not well seen due to  streak artifact from the patient's adjacent arms. Abdomen and pelvis CT with oral and IV contrast could be used to further evaluate as clinically warranted. Electronically Signed   By: Misty Stanley M.D.   On: 02/10/2017 15:31   Dg Chest Portable 1 View  Result Date: 02/24/2017 CLINICAL DATA:  Shortness of breath, productive cough, fever since Friday, history diabetes mellitus, hypertension, hiatal hernia, colon cancer EXAM: PORTABLE CHEST 1 VIEW COMPARISON:  Portable exam 1142 hours  compared to 11/20/2016 CT chest FINDINGS: RIGHT jugular Port-A-Cath with tip projecting over SVC. Normal heart size and mediastinal contours. Extensive opacity RIGHT upper lobe which could represent infiltrate or progressive metastases. Persistent nodular density LEFT base corresponding to known metastasis. Bibasilar opacities question atelectasis versus infiltrate. RIGHT pleural effusion not excluded. No pneumothorax. IMPRESSION: Persistent LEFT lung base nodule/metastasis. Bibasilar atelectasis versus infiltrate with progressive opacity in the RIGHT upper lobe which could represent acute infiltrate or progressive metastatic disease; CT chest with contrast recommended for further assessment. Electronically Signed   By: Lavonia Dana M.D.   On: 02/10/2017 12:14    EKG: Independently reviewed.  Left anterior fascicular block. No Prior EKGs to compare.  Assessment/Plan Principal Problem:   Necrotic pneumonia (HCC) Active Problems:   Metastatic colon cancer to liver ([place])   Severe protein-calorie malnutrition ([place])   Diabetes mellitus without complication ([place])   Palliative care encounter  Necrotic pneumonia- SOB, cough, WBC- 13.6. Intermittent tachypnea. Reported fever. Soft bp. Ill appearing. Meets Sepsis criteria. Immunocompromised.  -Change IV antibiotics to Zosyn to include anaerobic coverage, considering necrosis / possible abscess on CT. D/c cefepime. -Continue IV vancomycin started in ED  - Will get Blood cultures  x2  -  Stat lactic acid -2.1., Ns bolus 581mls, trend. -Pulmonoly consult in a.m- order placed -Hydrate  Stage IV colon cancer-follows with Dr. Twana First.  Currently on treatment getting chemo. -May Benefit from oncology evaluation inpatient - DNR-confirmed with patient's son at bedside, DNR form present - May benefit from palliative care this admission, have had Palliative care encounters in the past.  Fall- With continued lethargy, weakness, ability to ambulate, poor functional status and decline X 5 days -Will get head CT- Negative for acute abnormality - Pelvic Xray-negative for fracture.  Elevated troponin- 0.1. In the setting of necrotic pneumonia/abscess, sepsis. EKG- left ant fascicular block, no old EKGs to compare. No chest pain. - trend troponin  Diarrhea- >1 months .[place] 2/2 chemo. Antimotilty agents not effective. Clinically appears dehydrated. Stable Cr. - Stool for C. Diff, if neg can use antimotility agents - Hydrate  Protein energy malnutrition- Albumin- 1.5. - Ensure  DVT prophylaxis: Lovenox Code Status: DNR Family Communication: Son at bedside Disposition Plan: To be determined Consults called: Pulmonology Admission status: Inpatient, telemetry   Big Coppitt Key MD [facility] Pager [phone]  If 7PM-7AM, please contact night-coverage [email or web address] Password [facility]  02/25/2017, 6:13 PM

## 2017-02-11 NOTE — ED Notes (Addendum)
Family reports pt had a fall on Tuesday, unknown LOC. Family reports pt was on the floor for unknown amount of time. Ever since then pt has been very weak and not ambulatory. Family reports up until the fall pt was living by himself and ambulating independently.

## 2017-02-11 NOTE — ED Provider Notes (Signed)
[facility] EMERGENCY DEPARTMENT Provider Note   CSN: [id number] Arrival date & time: 03/03/2017  1058     History   Chief Complaint Chief Complaint  Patient presents with  . Shortness of Breath    HPI Cory Robinson is a 72 y.o. male.  Level 5 caveat for acuity of condition.  History obtained from the 2 children.  Patient allegedly fell at his home on Tuesday and could not get up.  He has subsequently been very weak and unable to independently walk.  He is being treated with chemotherapy for stage IV colon cancer.  No fever, sweats, chills, dyspnea.  He does complain of right chest pain.      Past Medical History:  Diagnosis Date  . Cancer [facility])    colon cancer  . Diabetes mellitus without complication ([place])   . Duodenitis   . Dyspnea    increased exertion  . Esophagitis   . Hiatal hernia   . Hypertension    pt states is currently on no medications     Patient Active Problem List   Diagnosis Date Noted  . Goals of care, counseling/discussion   . Palliative care encounter   . Encounter for hospice care discussion   . Metastatic colon cancer to liver ([place]) 11/20/2016  . Anemia due to chronic blood loss 11/20/2016  . Severe protein-calorie malnutrition ([place]) 11/20/2016  . Diabetes mellitus without complication ([place]) 65/46/5035  . AKI (acute kidney injury) ([place]) 11/19/2016    Past Surgical History:  Procedure Laterality Date  . CHOLECYSTECTOMY    . EYE SURGERY    . hemrroidectomy    . IR FLUORO GUIDE PORT INSERTION RIGHT  12/11/2016  . IR US GUIDE BX ASP/DRAIN  12/11/2016  . IR US GUIDE VASC ACCESS RIGHT  12/11/2016       Home Medications    Prior to Admission medications   Medication Sig Start Date End Date Taking? Authorizing Provider  aspirin EC 81 MG tablet Take 81 mg by mouth daily.   Yes [provider]  Bevacizumab (AVASTIN IV) Inject into the vein.   Yes [provider]  dexamethasone (DECADRON) 4 MG tablet Take 2 tablets (8 mg  total) by mouth daily. Start the day after chemotherapy for 2 days. Take with food. 12/04/16  Yes Twana First, MD  diphenoxylate-atropine (LOMOTIL) 2.5-0.025 MG tablet Take 1 tablet by mouth 4 (four) times daily as needed for diarrhea or loose stools. 11/30/16  Yes Holley Bouche, NP  FLUOROURACIL IV Inject into the vein.   Yes [provider]  gemfibrozil (LOPID) 600 MG tablet Take 600 mg by mouth daily.   Yes [provider]  leucovorin in dextrose 5 % 250 mL Inject into the vein once.   Yes [provider]  lidocaine-prilocaine (EMLA) cream Apply to affected area once 12/04/16  Yes Twana First, MD  Misc. Devices MISC Please provide patient with bath / shower chair 12/12/16  Yes Twana First, MD  Misc. Devices MISC Please provide patient with adult incontinence briefs. 12/12/16  Yes Twana First, MD  Misc. Devices MISC Please provide patient with incontinence pads 12/12/16  Yes Twana First, MD  morphine (MS CONTIN) 15 MG 12 hr tablet Take 1 or 2 tabs by mouth every 12 hours as directed. 12/25/16  Yes Twana First, MD  Multiple Vitamin (MULTIVITAMIN WITH MINERALS) TABS tablet Take 1 tablet by mouth daily. 11/22/16  Yes Johnson, Clanford L, MD  ondansetron (ZOFRAN) 8 MG tablet Take 1 tablet (  8 mg total) by mouth 2 (two) times daily as needed for refractory nausea / vomiting. Start on day 3 after chemotherapy. 12/04/16  Yes Twana First, MD  OXALIPLATIN IV Inject into the vein.   Yes [provider]  oxyCODONE (OXY IR/ROXICODONE) 5 MG immediate release tablet Take 1 or 2 tabs every 4 hours as needed for pain 12/25/16  Yes Twana First, MD  pantoprazole (PROTONIX) 40 MG tablet Take 40 mg by mouth daily.  10/25/16  Yes [provider]  potassium chloride SA (K-DUR,KLOR-CON) 20 MEQ tablet Take 1 tablet (20 mEq total) by mouth 2 (two) times daily. 01/31/17  Yes Holley Bouche, NP  prochlorperazine (COMPAZINE) 10 MG tablet Take 1 tablet (10 mg total) by  mouth every 6 (six) hours as needed (Nausea or vomiting). 12/04/16  Yes Twana First, MD  promethazine (PHENERGAN) 12.5 MG tablet Take 12.5 mg by mouth every 6 (six) hours as needed for nausea or vomiting.   Yes [provider]  Sennosides (SENNA) 8.6 MG CAPS Take 1 capsule by mouth daily as needed. 11/21/16  Yes Johnson, Clanford L, MD  vitamin B-12 (CYANOCOBALAMIN) 1000 MCG tablet Take 1,000 mcg by mouth daily.   Yes [provider]    Family History History reviewed. No pertinent family history.  Social History Social History   Tobacco Use  . Smoking status: Current Every Day Smoker    Packs/day: 1.00    Years: 55.00    Pack years: 55.00    Types: Cigarettes  . Smokeless tobacco: Never Used  Substance Use Topics  . Alcohol use: Yes    Comment: daily until 3 months ago  . Drug use: No     Allergies   Patient has no known allergies.   Review of Systems Review of Systems  Unable to perform ROS: Acuity of condition     Physical Exam Updated Vital Signs BP 101/61   Pulse 82   Temp (!) 97.3 F (36.3 C) (Rectal)   Resp 18   Ht 5\' 7"  (1.702 m)   Wt 65.8 kg (145 lb)   SpO2 93%   BMI 22.71 kg/m   Physical Exam  Constitutional:  Weak appearing, febrile, questionably jaundiced  HENT:  Head: Normocephalic and atraumatic.  Eyes: Conjunctivae are normal.  Neck: Neck supple.  Cardiovascular: Normal rate and regular rhythm.  Pulmonary/Chest: Effort normal and breath sounds normal.  Abdominal: Soft. Bowel sounds are normal.  Musculoskeletal: Normal range of motion.  Neurological: He is alert.  Skin: Skin is warm and dry.  Psychiatric:  Flat affect  Nursing note and vitals reviewed.    ED Treatments / Results  Labs (all labs ordered are listed, but only abnormal results are displayed) Labs Reviewed  CBC WITH DIFFERENTIAL/PLATELET - Abnormal; Notable for the following components:      Result Value   WBC 13.6 (*)    RBC 3.35 (*)     Hemoglobin 9.9 (*)    HCT 32.7 (*)    RDW 16.5 (*)    Neutro Abs 11.3 (*)    All other components within normal limits  COMPREHENSIVE METABOLIC PANEL - Abnormal; Notable for the following components:   Chloride 100 (*)    Glucose, Bld 108 (*)    BUN 32 (*)    Calcium 8.2 (*)    Total Protein 5.2 (*)    Albumin 1.5 (*)    AST 94 (*)    Alkaline Phosphatase 577 (*)    Total Bilirubin 2.0 (*)  All other components within normal limits  TROPONIN I - Abnormal; Notable for the following components:   Troponin I 0.10 (*)    All other components within normal limits  URINALYSIS, ROUTINE W REFLEX MICROSCOPIC - Abnormal; Notable for the following components:   Color, Urine AMBER (*)    Bilirubin Urine SMALL (*)    Protein, ur 30 (*)    Squamous Epithelial / LPF 0-5 (*)    All other components within normal limits    EKG  EKG Interpretation  Date/Time:  Sunday February 11 2017 11:16:13 EST Ventricular Rate:  91 PR Interval:    QRS Duration: 119 QT Interval:  370 QTC Calculation: 456 R Axis:   -57 Text Interpretation:  Sinus rhythm Left anterior fascicular block Anterior infarct, old Confirmed by Nat Christen [phone]) on 03/08/2017 3:30:42 PM       Radiology Ct Chest W Contrast  Result Date: 03/03/2017 CLINICAL DATA:  Shortness of breath and fever EXAM: CT CHEST WITH CONTRAST TECHNIQUE: Multidetector CT imaging of the chest was performed during intravenous contrast administration. CONTRAST:  16mL ISOVUE-300 IOPAMIDOL (ISOVUE-300) INJECTION 61% COMPARISON:  11/20/2016 FINDINGS: Cardiovascular: Heart size upper normal. No pericardial effusion. Atherosclerotic calcification is noted in the wall of the thoracic aorta. Right Port-A-Cath tip is positioned in the upper right atrium. Mediastinum/Nodes: scattered small mediastinal lymph nodes evident. No gross hilar lymphadenopathy. The esophagus has normal imaging features. There is no axillary lymphadenopathy. Lungs/Pleura: Dense right upper  lobe airspace consolidation has central cavitation with air-fluid levels. This is new in the interval. There is right lower lobe collapse/ consolidation with moderate right pleural effusion. 1.7 cm nodule anterior left lower lobe has decreased from 2.2 cm previously. There is left lower lobe collapse/ consolidation with small left pleural effusion. 5 mm left upper lobe pulmonary nodule is similar. Other scattered tiny bilateral pulmonary nodules are evident. Upper Abdomen: Bulky metastatic disease again noted in the liver. There is probably some ascites and edema in the soft tissues the liver but this area is obscured by streak artifact from the patient's arms. Musculoskeletal: Bone windows reveal no worrisome lytic or sclerotic osseous lesions. IMPRESSION: 1. Large right upper lobe necrotic consolidative process, likely pneumonia with lung abscess or necrotic pneumonia. Rapid interval appearance makes metastatic involvement less likely. 2. Bilateral pulmonary nodules with slight decrease in size of the dominant left lower lobe pulmonary nodule described on the prior study. 3. Bilateral lower lobe collapse/consolidation with small to moderate right and small left pleural effusions. 4. Bulky hepatic metastases. 5. Probable ascites in the upper abdomen although upper abdomen not well seen due to streak artifact from the patient's adjacent arms. Abdomen and pelvis CT with oral and IV contrast could be used to further evaluate as clinically warranted. Electronically Signed   By: Misty Stanley M.D.   On: 02/26/2017 15:31   Dg Chest Portable 1 View  Result Date: 02/06/2017 CLINICAL DATA:  Shortness of breath, productive cough, fever since Friday, history diabetes mellitus, hypertension, hiatal hernia, colon cancer EXAM: PORTABLE CHEST 1 VIEW COMPARISON:  Portable exam 1142 hours compared to 11/20/2016 CT chest FINDINGS: RIGHT jugular Port-A-Cath with tip projecting over SVC. Normal heart size and mediastinal contours.  Extensive opacity RIGHT upper lobe which could represent infiltrate or progressive metastases. Persistent nodular density LEFT base corresponding to known metastasis. Bibasilar opacities question atelectasis versus infiltrate. RIGHT pleural effusion not excluded. No pneumothorax. IMPRESSION: Persistent LEFT lung base nodule/metastasis. Bibasilar atelectasis versus infiltrate with progressive opacity in the RIGHT upper  lobe which could represent acute infiltrate or progressive metastatic disease; CT chest with contrast recommended for further assessment. Electronically Signed   By: Lavonia Dana M.D.   On: 03/03/2017 12:14    Procedures Procedures (including critical care time)  Medications Ordered in ED Medications  ceFEPIme (MAXIPIME) 2 g in dextrose 5 % 50 mL IVPB (not administered)  vancomycin (VANCOCIN) IVPB 1000 mg/200 mL premix (not administered)  0.9 %  sodium chloride infusion ( Intravenous New Bag/Given 02/14/2017 1339)  iopamidol (ISOVUE-300) 61 % injection 75 mL (75 mLs Intravenous Contrast Given 02/10/2017 1428)     Initial Impression / Assessment and Plan / ED Course  I have reviewed the triage vital signs and the nursing notes.  Pertinent labs & imaging results that were available during my care of the patient were reviewed by me and considered in my medical decision making (see chart for details).   Patient is immunocompromised secondary to stage IV colon cancer.  CT of chest today reveals a large right upper lobe necrotic consolidation.  Additionally, his troponin is minimally elevated.  EKG nonacute.  Will hydrate, start IV Vanco, IV Maxipime.  Admit to general medicine.   CRITICAL CARE Performed by: Nat Christen  ?  Total critical care time: 30 minutes  Critical care time was exclusive of separately billable procedures and treating other patients.  Critical care was necessary to treat or prevent imminent or life-threatening deterioration.  Critical care was time spent  personally by me on the following activities: development of treatment plan with patient and/or surrogate as well as nursing, discussions with consultants, evaluation of patient's response to treatment, examination of patient, obtaining history from patient or surrogate, ordering and performing treatments and interventions, ordering and review of laboratory studies, ordering and review of radiographic studies, pulse oximetry and re-evaluation of patient's condition.    Final Clinical Impressions(s) / ED Diagnoses   Final diagnoses:  HCAP (healthcare-associated pneumonia)  Elevated troponin    ED Discharge Orders    None       Nat Christen, MD 02/10/2017 1658

## 2017-02-11 NOTE — ED Notes (Signed)
CRITICAL VALUE ALERT  Critical Value:  Troponin 0.10  Date & Time Notied:  02/14/2017 at 1422  Provider Notified: Dr. Lacinda Axon   Orders Received/Actions taken:

## 2017-02-12 ENCOUNTER — Encounter (HOSPITAL_COMMUNITY): Payer: Self-pay | Admitting: Primary Care

## 2017-02-12 ENCOUNTER — Other Ambulatory Visit: Payer: Self-pay

## 2017-02-12 DIAGNOSIS — C787 Secondary malignant neoplasm of liver and intrahepatic bile duct: Secondary | ICD-10-CM

## 2017-02-12 DIAGNOSIS — W19XXXA Unspecified fall, initial encounter: Secondary | ICD-10-CM

## 2017-02-12 DIAGNOSIS — C189 Malignant neoplasm of colon, unspecified: Secondary | ICD-10-CM

## 2017-02-12 DIAGNOSIS — R7989 Other specified abnormal findings of blood chemistry: Secondary | ICD-10-CM

## 2017-02-12 DIAGNOSIS — R748 Abnormal levels of other serum enzymes: Secondary | ICD-10-CM

## 2017-02-12 DIAGNOSIS — E43 Unspecified severe protein-calorie malnutrition: Secondary | ICD-10-CM

## 2017-02-12 DIAGNOSIS — Z7189 Other specified counseling: Secondary | ICD-10-CM

## 2017-02-12 DIAGNOSIS — R778 Other specified abnormalities of plasma proteins: Secondary | ICD-10-CM

## 2017-02-12 DIAGNOSIS — W19XXXD Unspecified fall, subsequent encounter: Secondary | ICD-10-CM

## 2017-02-12 DIAGNOSIS — J850 Gangrene and necrosis of lung: Secondary | ICD-10-CM

## 2017-02-12 DIAGNOSIS — Z515 Encounter for palliative care: Secondary | ICD-10-CM

## 2017-02-12 LAB — CBC
HCT: 31.5 % — ABNORMAL LOW (ref 39.0–52.0)
Hemoglobin: 9.8 g/dL — ABNORMAL LOW (ref 13.0–17.0)
MCH: 29.9 pg (ref 26.0–34.0)
MCHC: 31.1 g/dL (ref 30.0–36.0)
MCV: 96 fL (ref 78.0–100.0)
PLATELETS: 198 10*3/uL (ref 150–400)
RBC: 3.28 MIL/uL — ABNORMAL LOW (ref 4.22–5.81)
RDW: 16.9 % — AB (ref 11.5–15.5)
WBC: 10.7 10*3/uL — ABNORMAL HIGH (ref 4.0–10.5)

## 2017-02-12 LAB — BASIC METABOLIC PANEL
Anion gap: 12 (ref 5–15)
BUN: 33 mg/dL — AB (ref 6–20)
CALCIUM: 7.9 mg/dL — AB (ref 8.9–10.3)
CHLORIDE: 100 mmol/L — AB (ref 101–111)
CO2: 28 mmol/L (ref 22–32)
CREATININE: 0.98 mg/dL (ref 0.61–1.24)
GFR calc non Af Amer: >60 mL/min (ref >60)
Glucose, Bld: 77 mg/dL (ref 65–99)
Potassium: 3.8 mmol/L (ref 3.5–5.1)
SODIUM: 140 mmol/L (ref 135–145)

## 2017-02-12 LAB — TROPONIN I
TROPONIN I: 0.16 ng/mL — AB (ref <0.03)
Troponin I: 0.14 ng/mL (ref <0.03)

## 2017-02-12 LAB — LACTIC ACID, PLASMA
LACTIC ACID, VENOUS: 2.1 mmol/L — AB (ref 0.5–1.9)
LACTIC ACID, VENOUS: 1.8 mmol/L (ref 0.5–1.9)

## 2017-02-12 MED ORDER — OXYCODONE-ACETAMINOPHEN 5-325 MG PO TABS: 1.0000 | ORAL_TABLET | ORAL | Status: DC | PRN

## 2017-02-12 MED ORDER — PANTOPRAZOLE SODIUM 40 MG IV SOLR
40.0000 mg | INTRAVENOUS | Status: DC
  Administered 2017-02-12: 40 mg via INTRAVENOUS
  Filled 2017-02-12: qty 40

## 2017-02-12 MED ORDER — SODIUM CHLORIDE 0.9 % IV BOLUS (SEPSIS)
500.0000 mL | Freq: Once | INTRAVENOUS | Status: AC
  Administered 2017-02-12: 500 mL via INTRAVENOUS

## 2017-02-12 MED ORDER — POTASSIUM CHLORIDE IN NACL 20-0.9 MEQ/L-% IV SOLN
INTRAVENOUS | Status: DC
  Administered 2017-02-12: 11:00:00 via INTRAVENOUS

## 2017-02-12 MED ORDER — ONDANSETRON HCL 4 MG/2ML IJ SOLN: 4.0000 mg | Freq: Four times a day (QID) | INTRAMUSCULAR | Status: DC | PRN

## 2017-02-12 MED ORDER — IPRATROPIUM-ALBUTEROL 0.5-2.5 (3) MG/3ML IN SOLN
3.0000 mL | Freq: Four times a day (QID) | RESPIRATORY_TRACT | Status: DC
  Administered 2017-02-12 (×2): 3 mL via RESPIRATORY_TRACT
  Filled 2017-02-12 (×2): qty 3

## 2017-02-12 MED ORDER — ORAL CARE MOUTH RINSE
15.0000 mL | Freq: Two times a day (BID) | OROMUCOSAL | Status: DC
  Administered 2017-02-12: 15 mL via OROMUCOSAL

## 2017-02-12 MED ORDER — LORAZEPAM 2 MG/ML IJ SOLN: 1.0000 mg | INTRAMUSCULAR | Status: DC | PRN

## 2017-02-12 MED ORDER — GERHARDT'S BUTT CREAM
TOPICAL_CREAM | Freq: Two times a day (BID) | CUTANEOUS | Status: DC
  Filled 2017-02-12: qty 1

## 2017-02-12 MED ORDER — HYDROMORPHONE HCL 1 MG/ML IJ SOLN
0.5000 mg | INTRAMUSCULAR | Status: DC | PRN
  Administered 2017-02-12: 0.5 mg via INTRAVENOUS
  Filled 2017-02-12: qty 1

## 2017-02-12 MED ORDER — MORPHINE SULFATE (PF) 4 MG/ML IV SOLN
4.0000 mg | INTRAVENOUS | Status: DC | PRN
  Administered 2017-02-12 – 2017-02-13 (×8): 4 mg via INTRAVENOUS
  Filled 2017-02-12 (×9): qty 1

## 2017-02-12 NOTE — Plan of Care (Signed)
Meeting with Cory Robinson, and his stepson/healthcare power of attorney, Cory Robinson.  Cory Robinson has requested for Cory Robinson to be given oxygen via nonrebreather.  I ask Cory Robinson if we can talk in the hallway, but he declined stating that Cory Robinson can hear also.  We talked about medicine/oxygen extending life, but also extending the dying process.    I share with Cory Robinson that I have experienced oxygen keeping the dying tethered here for days, without changing what is happening, or providing comfort.  Cory Robinson states that Cory Robinson has pulled at the mask a few times, but after adjustment this has lessened.  I ask Cory Robinson how long he sees Cory Robinson having nonrebreather/high oxygen.  Cory Robinson states that he realizes, at some point, even with high oxygen, Cory Robinson will not be able to sustain.  Cory Robinson then shares that his goal is for his wife to arrive.  He states that she will be here approximately 6 PM.  I shared my worry that I believe Cory Robinson's time is short, hours to days, complicated by the use of oxygen. No charge

## 2017-02-12 NOTE — Progress Notes (Addendum)
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  The patient was found on the bathroom floor on New Year's Day.  It is unclear the circumstances around his fall whether it was mechanical or syncope.  The patient is unable to provide any history secondary to his lethargy and altered mental status.  Nevertheless, the patient was brought in by his son secondary to increasing lethargy to the point where he is unable to get out of bed.  The patient has also been having increasing cough with green sputum and had a fever 101.0 F on 02/09/2017.  In the emergency department, the patient was afebrile hemodynamically stable with oxygen saturation 90% room air.  CT of the chest revealed dense right upper lobe consolidation with central cavitation with air-fluid levels.  There is also right lower lobe and left lower lobe collapse with consolidation.  The patient's son noted that the patient has been choking on his feet for the past 3-4 days.  Assessment/Plan:  Necrotizing pneumonia -03/08/2017 CT chest as discussed above -Plan to treat as an infectious process although I am concerned this may represent a necrotic tumor -Continue vancomycin and zosyn -suspect component of aspiration pneumonia -start BDs -start IVF with KCl  Dysphagia -Speech therapy evaluation -holding po meds until evaluated  Elevated troponin -Likely demand ischemia in the setting of infectious process -Personally reviewed EKG--sinus rhythm, no concerning ST-T wave changes -Trend troponin  Metastatic colon cancer -Metastasis to liver and lung -Last chemotherapy 01/31/2017 -Palliative medicine consultation  Diarrhea -Likely secondary to 5-FU -C. difficile has  been ordered  Pain associated with neoplastic disease -Restart MS Contin when able to tolerate po -Start IV Dilaudid  Severe protein malnutrition -Nutrition consult -Continue supplements      Disposition Plan:  Home with hospice 2-3 days Family Communication:   Son updated at bedside 1/7--Total time spent 35 minutes.  Greater than 50% spent face to face counseling and coordinating care.   Consultants:  Pulmonary, palliative  Code Status: DNR  DVT Prophylaxis:  [place] Lovenox   Procedures: As Listed in Progress Note Above  Antibiotics: vanco 1/6>>> Zosyn 1/6>>>1/7 Cefepime 1/7>>>    Subjective: Patient denies any shortness of breath but complains of chest pain abdominal pain.  Denies any fevers, chills, headache, neck pain.  He is coughing with food.  Objective: Vitals:   03/08/2017 1959 02/23/2017 2008 02/09/2017 2045 02/12/17 0510  BP:   (!) 109/58 (!) 103/56  Pulse:   89 85  Resp:   18 18  Temp: 98.1 F (36.7 C)  98.3 F (36.8 C) 97.6 F (36.4 C)  TempSrc: Oral  Oral Oral  SpO2:  92% 91% 92%  Weight:   62.4 kg (137 lb 9.1 oz)   Height:   5\' 7"  (1.702 m)     Intake/Output Summary (Last 24 hours) at 02/12/2017 0909 Last data filed at 02/12/2017 2637 Gross per 24 hour  Intake 1741.67 ml  Output -  Net 1741.67 ml   Weight change:  Exam:   General:  Pt is alert, follows commands appropriately, not in acute distress  HEENT: No icterus, No thrush, No neck mass, [place]/AT  Cardiovascular: RRR, S1/S2, no rubs, no gallops  Respiratory: Bilateral scattered  rhonchi.  Abdomen: Soft/+BS,  diffusely tender, non distended, no guarding  Extremities: 2 + LE edema, No lymphangitis, No petechiae, No rashes, no synovitis   Data Reviewed: I have personally reviewed following labs and imaging studies Basic Metabolic Panel: Recent Labs  Lab 02/12/2017 1335 02/12/17 0447  NA 142 140  K 4.1 3.8  CL 100* 100*  CO2 29 28  GLUCOSE 108* 77  BUN 32* 33*  CREATININE 0.98 0.98   CALCIUM 8.2* 7.9*   Liver Function Tests: Recent Labs  Lab 02/09/2017 1335  AST 94*  ALT 30  ALKPHOS 577*  BILITOT 2.0*  PROT 5.2*  ALBUMIN 1.5*   No results for input(s): LIPASE, AMYLASE in the last 168 hours. No results for input(s): AMMONIA in the last 168 hours. Coagulation Profile: No results for input(s): INR, PROTIME in the last 168 hours. CBC: Recent Labs  Lab 02/06/2017 1335 02/12/17 0447  WBC 13.6* 10.7*  NEUTROABS 11.3*  --   HGB 9.9* 9.8*  HCT 32.7* 31.5*  MCV 97.6 96.0  PLT 203 198   Cardiac Enzymes: Recent Labs  Lab 03/08/2017 1335 03/01/2017 2257 02/12/17 0447  TROPONINI 0.10* 0.16* 0.14*   BNP: Invalid input(s): POCBNP CBG: No results for input(s): GLUCAP in the last 168 hours. HbA1C: No results for input(s): HGBA1C in the last 72 hours. Urine analysis:    Component Value Date/Time   COLORURINE AMBER (A) 02/19/2017 1545   APPEARANCEUR CLEAR 02/28/2017 1545   LABSPEC 1.020 03/01/2017 1545   PHURINE 5.0 03/07/2017 1545   GLUCOSEU NEGATIVE 02/26/2017 1545   HGBUR NEGATIVE 02/12/2017 1545   BILIRUBINUR SMALL (A) 02/06/2017 1545   KETONESUR NEGATIVE 03/06/2017 1545   PROTEINUR 30 (A) 03/07/2017 1545   NITRITE NEGATIVE 03/06/2017 1545   LEUKOCYTESUR NEGATIVE 02/26/2017 1545   Sepsis Labs: @LABRCNTIP (procalcitonin:4,lacticidven:4) ) Recent Results (from the past 240 hour(s))  Culture, blood (routine x 2)     Status: None (Preliminary result)   Collection Time: 02/10/2017  6:04 PM  Result Value Ref Range Status   Specimen Description BLOOD PORTA CATH DRAWN BY RN  Final   Special Requests   Final    BOTTLES DRAWN AEROBIC AND ANAEROBIC Blood Culture adequate volume   Culture NO GROWTH < 12 HOURS  Final   Report Status PENDING  Incomplete  Culture, blood (routine x 2)     Status: None (Preliminary result)   Collection Time: 02/28/2017  7:14 PM  Result Value Ref Range Status   Specimen Description LEFT ANTECUBITAL  Final   Special Requests   Final      BOTTLES DRAWN AEROBIC AND ANAEROBIC Blood Culture adequate volume   Culture NO GROWTH < 12 HOURS  Final   Report Status PENDING  Incomplete     Scheduled Meds: . aspirin EC  81 mg Oral Daily  . enoxaparin (LOVENOX) injection  40 mg Subcutaneous Q24H  . feeding supplement (GLUCERNA SHAKE)  237 mL Oral TID BM  . gemfibrozil  600 mg Oral Daily  . mouth rinse  15 mL Mouth Rinse BID  . morphine  15 mg Oral Q12H  . pantoprazole  40 mg Oral Daily   Continuous Infusions: . piperacillin-tazobactam (ZOSYN)  IV 3.375 g (02/12/17 0830)  . vancomycin      Procedures/Studies: Dg Pelvis 1-2 Views  Result Date: 02/22/2017 CLINICAL DATA:  Pain after fall 2 days ago. EXAM: PELVIS - 1-2 VIEW COMPARISON:  None. FINDINGS: The left hip is internally rotated and poorly evaluated. No obvious fracture. No  right hip fracture identified on this single frontal view. No pelvic bone fractures are noted. IMPRESSION: Evaluation of the left hip is limited due to internal rotation. However, no fractures are seen on this study. Dedicated imaging could be obtained if the hips if clinical concern persists. Electronically Signed   By: Dorise Bullion III M.D   On: 02/23/2017 19:56   Ct Head Wo Contrast  Result Date: 02/07/2017 CLINICAL DATA:  Encephalopathy.  Fall 5 days ago. EXAM: CT HEAD WITHOUT CONTRAST TECHNIQUE: Contiguous axial images were obtained from the base of the skull through the vertex without intravenous contrast. COMPARISON:  None. FINDINGS: Brain: No subdural, epidural, or subarachnoid hemorrhage. Cerebellum, brainstem, and basal cisterns are normal. Ventricles and sulci are unremarkable. No mass effect or midline shift. White matter changes identified. No acute cortical ischemia or infarct. Vascular: No hyperdense vessel or unexpected calcification. Skull: Normal. Negative for fracture or focal lesion. Sinuses/Orbits: No acute finding. Other: None. IMPRESSION: No acute intracranial abnormality.  Electronically Signed   By: Dorise Bullion III M.D   On: 03/04/2017 19:48   Ct Chest W Contrast  Result Date: 02/16/2017 CLINICAL DATA:  Shortness of breath and fever EXAM: CT CHEST WITH CONTRAST TECHNIQUE: Multidetector CT imaging of the chest was performed during intravenous contrast administration. CONTRAST:  63mL ISOVUE-300 IOPAMIDOL (ISOVUE-300) INJECTION 61% COMPARISON:  11/20/2016 FINDINGS: Cardiovascular: Heart size upper normal. No pericardial effusion. Atherosclerotic calcification is noted in the wall of the thoracic aorta. Right Port-A-Cath tip is positioned in the upper right atrium. Mediastinum/Nodes: scattered small mediastinal lymph nodes evident. No gross hilar lymphadenopathy. The esophagus has normal imaging features. There is no axillary lymphadenopathy. Lungs/Pleura: Dense right upper lobe airspace consolidation has central cavitation with air-fluid levels. This is new in the interval. There is right lower lobe collapse/ consolidation with moderate right pleural effusion. 1.7 cm nodule anterior left lower lobe has decreased from 2.2 cm previously. There is left lower lobe collapse/ consolidation with small left pleural effusion. 5 mm left upper lobe pulmonary nodule is similar. Other scattered tiny bilateral pulmonary nodules are evident. Upper Abdomen: Bulky metastatic disease again noted in the liver. There is probably some ascites and edema in the soft tissues the liver but this area is obscured by streak artifact from the patient's arms. Musculoskeletal: Bone windows reveal no worrisome lytic or sclerotic osseous lesions. IMPRESSION: 1. Large right upper lobe necrotic consolidative process, likely pneumonia with lung abscess or necrotic pneumonia. Rapid interval appearance makes metastatic involvement less likely. 2. Bilateral pulmonary nodules with slight decrease in size of the dominant left lower lobe pulmonary nodule described on the prior study. 3. Bilateral lower lobe  collapse/consolidation with small to moderate right and small left pleural effusions. 4. Bulky hepatic metastases. 5. Probable ascites in the upper abdomen although upper abdomen not well seen due to streak artifact from the patient's adjacent arms. Abdomen and pelvis CT with oral and IV contrast could be used to further evaluate as clinically warranted. Electronically Signed   By: Misty Stanley M.D.   On: 02/20/2017 15:31   Dg Chest Portable 1 View  Result Date: 02/14/2017 CLINICAL DATA:  Shortness of breath, productive cough, fever since Friday, history diabetes mellitus, hypertension, hiatal hernia, colon cancer EXAM: PORTABLE CHEST 1 VIEW COMPARISON:  Portable exam 1142 hours compared to 11/20/2016 CT chest FINDINGS: RIGHT jugular Port-A-Cath with tip projecting over SVC. Normal heart size and mediastinal contours. Extensive opacity RIGHT upper lobe which could represent infiltrate or progressive metastases. Persistent nodular density  LEFT base corresponding to known metastasis. Bibasilar opacities question atelectasis versus infiltrate. RIGHT pleural effusion not excluded. No pneumothorax. IMPRESSION: Persistent LEFT lung base nodule/metastasis. Bibasilar atelectasis versus infiltrate with progressive opacity in the RIGHT upper lobe which could represent acute infiltrate or progressive metastatic disease; CT chest with contrast recommended for further assessment. Electronically Signed   By: Lavonia Dana M.D.   On: 02/27/2017 12:14    Orson Eva, DO  [facility] Pager [phone]  If 7PM-7AM, please contact night-coverage [email or web address] Password [email or web address] 02/12/2017, 9:09 AM   LOS: 1 day

## 2017-02-12 NOTE — Progress Notes (Signed)
CRITICAL VALUE ALERT  Critical Value:  Lactic acid = 2.1  Date & Time Notied:  02/12/17  0007  Provider Notified: Silas Sacramento, NP  Orders Received/Actions taken: New orders given and carried out.  Nursing staff to continue to monitor

## 2017-02-12 NOTE — Progress Notes (Signed)
SLP Cancellation Note  Patient Details Name: Davone Shinault MRN: [id number] DOB: 03/24/1945   Cancelled treatment:       Reason Eval/Treat Not Completed: Medical issues which prohibited therapy;Other (comment)(Pt transitioning to comfort care and SLP advised to sign off); Reconsult if indicated.  Thank you,  Genene Churn, [place]    [place] 02/12/2017, 5:01 PM

## 2017-02-12 NOTE — Progress Notes (Signed)
CRITICAL VALUE ALERT  Critical Value:  Troponin= 0.16  Date & Time Notied:  02/12/17 0026  Provider Notified: Silas Sacramento, NP  Orders Received/Actions taken: No new orders at this time

## 2017-02-12 NOTE — Consult Note (Signed)
Consultation Note Date: 02/12/2017   Patient Name: Cory Robinson  DOB: Apr 21, 1945  MRN: [id number]  Age / Sex: 72 y.o., male  PCP: Patient, [name] Per Referring Physician: Orson Eva, MD  Reason for Consultation: Establishing goals of care, Inpatient hospice referral and Psychosocial/spiritual support  Cory Robinson's responsible party is his stepson, Cory Robinson.  Cell phone [phone], home number [phone].     HPI/Patient Profile: 72 y.o. male  with past medical history of stage IV colon cancer with metastatic burden to liver and lungs, diabetes without complication, esophagitis, hiatal hernia, hypertension admitted on 02/27/2017 with necrotic pneumonia with a albumin of 1.5.   Clinical Assessment and Goals of Care: Cory Robinson is resting quietly in bed.  He is able to to briefly make but not keep eye contact.  He appears very weak, frail, cachectic.  He does not answer my questions, but will eventually tell me that he is having pain, all over.  There is no family at bedside at this time. Conference with nursing staff related to plan of care. Cory Robinson, Cory Robinson, was in the bathroom taking a shower during my visit.  I returned to talk with him about Cory Robinson's plan of care.  Weight shares that his stepfather was doing reasonably well until his fall 5 or 6 days ago.  He states that he was 75% better than he is now.    Cory Robinson states that family is requesting residential hospice.  I shared that this is a true kindness for Cory Robinson.  Weight is requesting a residential hospice near their home in [name].  I share with him that the local hospice home is in [place], there may be one in [place].  I share that social worker will meet with him related to inpatient hospice locations.  Cory Robinson states that Cory Robinson has not been eating, and feels that he is "starving".  We discussed what  is normal and expected near end of life including, sleeping more, interacting and eating less.  We talked about the bodies need for food being decreased, and food actually making patients uncomfortable.  We talked about dehydration and endorphins.  Cory Robinson and I also discussed what is and is not provided at hospice home including comfort feedings, medications for comfort and dignity, but no IV fluids or antibiotics.  Cory Robinson agrees.   We talked about prognosis, with permission, in the hallway.  I share that I feel Cory Robinson has days, possibly 2 weeks.  Healthcare power of attorney NEXT OF KIN -Cory Robinson, per Cory Robinson's decision during his 10/16-18 hospitalization.  SUMMARY OF RECOMMENDATIONS   Family is requesting comfort and dignity at end of life, transition to full comfort care, residential hospice.  Code Status/Advance Care Planning:  DNR  Symptom Management:   Per hospitalist, no additional needs at this time.  Son Cory Robinson has some concerns about overmedication and sleepiness.  We discussed finding balance for comfort and wakefulness.  Per hospice protocol.  Palliative Prophylaxis:   Frequent Pain Assessment,  Palliative Wound Care and Turn Reposition  Additional Recommendations (Limitations, Scope, Preferences):  Full Comfort Care  Psycho-social/Spiritual:   Desire for further Chaplaincy support:no  Additional Recommendations: Caregiving  Support/Resources and Education on Hospice  Prognosis:   < 2 weeks, would not be surprising based on current diagnosis of necrotic pneumonia, functional status, albumin 1.5, stage IV colon cancer with metastatic burden to liver and lungs.   Discharge Planning: Family is requesting residential hospice for comfort and dignity at end of life.  They are requesting facility near them in [name].      Primary Diagnoses: Present on Admission: . Necrotic pneumonia ([place]) . Metastatic colon cancer to liver ([place]) . Severe  protein-calorie malnutrition ([place])   I have reviewed the medical record, interviewed the patient and family, and examined the patient. The following aspects are pertinent.  Past Medical History:  Diagnosis Date  . Cancer [facility])    colon cancer  . Diabetes mellitus without complication ([place])   . Duodenitis   . Dyspnea    increased exertion  . Esophagitis   . Hiatal hernia   . Hypertension    pt states is currently on no medications    Social History   Socioeconomic History  . Marital status: Divorced    Spouse name: None  . Number of children: None  . Years of education: None  . Highest education level: None  Social Needs  . Financial resource strain: None  . Food insecurity - worry: None  . Food insecurity - inability: None  . Transportation needs - medical: None  . Transportation needs - non-medical: None  Occupational History  . None  Tobacco Use  . Smoking status: Current Every Day Smoker    Packs/day: 1.00    Years: 55.00    Pack years: 55.00    Types: Cigarettes  . Smokeless tobacco: Never Used  Substance and Sexual Activity  . Alcohol use: Yes    Comment: daily until 3 months ago  . Drug use: No  . Sexual activity: None  Other Topics Concern  . None  Social History Narrative  . None   History reviewed. No pertinent family history. Scheduled Meds: . enoxaparin (LOVENOX) injection  40 mg Subcutaneous Q24H  . feeding supplement (GLUCERNA SHAKE)  237 mL Oral TID BM  . Gerhardt's butt cream   Topical BID  . ipratropium-albuterol  3 mL Nebulization Q6H  . mouth rinse  15 mL Mouth Rinse BID  . morphine  15 mg Oral Q12H  . pantoprazole (PROTONIX) IV  40 mg Intravenous Q24H   Continuous Infusions: . 0.9 % NaCl with KCl 20 mEq / L 75 mL/hr at 02/12/17 1100  . piperacillin-tazobactam (ZOSYN)  IV Stopped (02/12/17 1317)  . vancomycin Stopped (02/12/17 1346)   PRN Meds:.HYDROmorphone (DILAUDID) injection, ondansetron (ZOFRAN) IV Medications Prior to  Admission:  Prior to Admission medications   Medication Sig Start Date End Date Taking? Authorizing Provider  aspirin EC 81 MG tablet Take 81 mg by mouth daily.   Yes [provider]  Bevacizumab (AVASTIN IV) Inject into the vein.   Yes [provider]  dexamethasone (DECADRON) 4 MG tablet Take 2 tablets (8 mg total) by mouth daily. Start the day after chemotherapy for 2 days. Take with food. 12/04/16  Yes Twana First, MD  diphenoxylate-atropine (LOMOTIL) 2.5-0.025 MG tablet Take 1 tablet by mouth 4 (four) times daily as needed for diarrhea or loose stools. 11/30/16  Yes Holley Bouche, NP  FLUOROURACIL IV  Inject into the vein.   Yes [provider]  gemfibrozil (LOPID) 600 MG tablet Take 600 mg by mouth daily.   Yes [provider]  leucovorin in dextrose 5 % 250 mL Inject into the vein once.   Yes [provider]  lidocaine-prilocaine (EMLA) cream Apply to affected area once 12/04/16  Yes Twana First, MD  Misc. Devices MISC Please provide patient with bath / shower chair 12/12/16  Yes Twana First, MD  Misc. Devices MISC Please provide patient with adult incontinence briefs. 12/12/16  Yes Twana First, MD  Misc. Devices MISC Please provide patient with incontinence pads 12/12/16  Yes Twana First, MD  morphine (MS CONTIN) 15 MG 12 hr tablet Take 1 or 2 tabs by mouth every 12 hours as directed. 12/25/16  Yes Twana First, MD  Multiple Vitamin (MULTIVITAMIN WITH MINERALS) TABS tablet Take 1 tablet by mouth daily. 11/22/16  Yes Johnson, Clanford L, MD  ondansetron (ZOFRAN) 8 MG tablet Take 1 tablet (8 mg total) by mouth 2 (two) times daily as needed for refractory nausea / vomiting. Start on day 3 after chemotherapy. 12/04/16  Yes Twana First, MD  OXALIPLATIN IV Inject into the vein.   Yes [provider]  oxyCODONE (OXY IR/ROXICODONE) 5 MG immediate release tablet Take 1 or 2 tabs every 4 hours as needed for pain 12/25/16  Yes Twana First,  MD  pantoprazole (PROTONIX) 40 MG tablet Take 40 mg by mouth daily.  10/25/16  Yes [provider]  potassium chloride SA (K-DUR,KLOR-CON) 20 MEQ tablet Take 1 tablet (20 mEq total) by mouth 2 (two) times daily. 01/31/17  Yes Holley Bouche, NP  prochlorperazine (COMPAZINE) 10 MG tablet Take 1 tablet (10 mg total) by mouth every 6 (six) hours as needed (Nausea or vomiting). 12/04/16  Yes Twana First, MD  promethazine (PHENERGAN) 12.5 MG tablet Take 12.5 mg by mouth every 6 (six) hours as needed for nausea or vomiting.   Yes [provider]  Sennosides (SENNA) 8.6 MG CAPS Take 1 capsule by mouth daily as needed. 11/21/16  Yes Johnson, Clanford L, MD  vitamin B-12 (CYANOCOBALAMIN) 1000 MCG tablet Take 1,000 mcg by mouth daily.   Yes [provider]   No Known Allergies Review of Systems  Unable to perform ROS: Acuity of condition    Physical Exam  Constitutional: He appears toxic. He appears ill.  Cachectic, appears acutely/chronically ill  HENT:  Head: Atraumatic.  Severe temporal wasting  Cardiovascular: Normal rate and regular rhythm.  Pulmonary/Chest: He exhibits tenderness.  Mild work of breathing noted  Abdominal: Soft.  Neurological: He is alert.  Does not answer questions  Skin: Skin is warm and dry.  Sores noted as per nursing  Psychiatric: His mood appears anxious. He is not agitated.  Unable to communicate his higher thoughts, only basic needs  Nursing note and vitals reviewed.   Vital Signs: BP (!) 103/56 (BP Location: Left Arm)   Pulse 85   Temp 97.6 F (36.4 C) (Oral)   Resp 18   Ht 5\' 7"  (1.702 m)   Wt 62.4 kg (137 lb 9.1 oz)   SpO2 92%   BMI 21.55 kg/m  Pain Assessment: No/denies pain   Pain Score: Asleep   SpO2: SpO2: 92 % O2 Device:SpO2: 92 % O2 Flow Rate: .O2 Flow Rate (L/min): 4 L/min  IO: Intake/output summary:   Intake/Output Summary (Last 24 hours) at 02/12/2017 1447 Last data filed at 02/12/2017 2694 Gross per 24  hour  Intake 1241.67 ml  Output -  Net 1241.67 ml    LBM:   Baseline Weight: Weight: 65.8 kg (145 lb) Most recent weight: Weight: 62.4 kg (137 lb 9.1 oz)     Palliative Assessment/Data:   Flowsheet Rows     Most Recent Value  Intake Tab  Referral Department  Hospitalist  Unit at Time of Referral  Med/Surg Unit  Palliative Care Primary Diagnosis  Cancer  Date Notified  02/12/17  Palliative Care Type  Return patient Palliative Care  Reason for referral  Counsel Regarding Hospice, End of [place], Clarify Goals of Care  Date of Admission  02/21/2017  Date first seen by Palliative Care  02/12/17  # of days Palliative referral response time  0 Day(s)  # of days IP prior to Palliative referral  1  Clinical Assessment  Palliative Performance Scale Score  20%  Pain Max last 24 hours  Not able to report  Pain Min Last 24 hours  Not able to report  Dyspnea Max Last 24 Hours  Not able to report  Dyspnea Min Last 24 hours  Not able to report  Psychosocial & Spiritual Assessment  Palliative Care Outcomes  Patient/Family meeting held?  Yes  Who was at the meeting?  Patient and son, Cory Robinson at bedside.  Palliative Care Outcomes  Counseled regarding hospice, Clarified goals of care, Provided psychosocial or spiritual support, Transitioned to hospice  Patient/Family wishes: Interventions discontinued/not started   Mechanical Ventilation      Time In: 1335 Time Out: 1425 Time Total: 50 minutes Greater than 50%  of this time was spent counseling and coordinating care related to the above assessment and plan.  Signed by: Drue Novel, NP   Please contact Palliative Medicine Team phone at [phone] for questions and concerns.  For individual provider: See Shea Evans

## 2017-02-12 NOTE — Progress Notes (Signed)
I was called by nursing due to the patient having tachypnea and oxygen desaturation into the mid 80s.  -I went to evaluate the patient -The patient is a mild to moderate extremis with shortness of breath -He complains of some shortness of breath  -Son is at the bedside -Patient currently receiving breathing treatment from respiratory With oxygen saturation 87% during the breathing treatment -I asked respiratory therapy to place the patient on nonrebreather  VS--noted in Epic CV--RRR Lung--bilateral rhonchi  I discussed the patient's clinical condition with the patient's son I discussed the patient's overall probable poor prognosis.  The son is in agreement that we should transition the patient's focus of care to that of full comfort.  DTat

## 2017-02-12 NOTE — Progress Notes (Signed)
Patient's step son is at bedside and understands from palliative care NP and MD that patient is in active process of dieing. I stood and listened while the patient's step son called and informed other members of the family of the patient's status, including the patient's biological daughter, Crystal. Patient has been given PRN morphine in an effort to slow down respirations and make more comfortable per MD order. Will continue to monitor patient and will continue to comfort the family.

## 2017-02-12 NOTE — Consult Note (Signed)
Consult requested by: [facility] hospitalist, Dr. 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  He is able to give a little bit of history but most of the history is from the medical record and from his son who is at bedside.  He is been coughing for about a month and short of breath for about 2 months.  He has had some fever.  He has chronic abdominal and chest pain.  He has been more lethargic.  He had been living alone but 5 days prior to admission he fell off the commode and was found on the floor and he is now living with his son.  His appetite is been poor.  He has had multiple watery stools but no vomiting.  Chest CT with contrast showed a large right upper lobe process.  I have personally reviewed the film.  He is not been coughing much up.  He is having more pain now.  His son requests an increase in pain medication.  He says he does not want him "knocked out" but he does not want him to be uncomfortable.  Past Medical History:  Diagnosis Date  . Cancer [facility])    colon cancer  . Diabetes mellitus without complication ([place])   . Duodenitis   . Dyspnea    increased exertion  . Esophagitis   . Hiatal hernia   . Hypertension    pt states is currently on no medications      History reviewed. No pertinent family history.   Social History   Socioeconomic History  . Marital status: Divorced    Spouse name: None  . Number of children: None  . Years of education: None  . Highest education level: None  Social Needs  . Financial resource strain: None  . Food insecurity - worry: None  . Food insecurity - inability: None  . Transportation needs - medical: None  . Transportation needs - non-medical: None  Occupational History  . None  Tobacco Use  . Smoking status: Current Every Day Smoker    Packs/day: 1.00    Years: 55.00    Pack years: 55.00    Types: Cigarettes   . Smokeless tobacco: Never Used  Substance and Sexual Activity  . Alcohol use: Yes    Comment: daily until 3 months ago  . Drug use: No  . Sexual activity: None  Other Topics Concern  . None  Social History Narrative  . None     ROS: Not obtainable    Objective: Vital signs in last 24 hours: Temp:  [97.3 F (36.3 C)-98.3 F (36.8 C)] 97.6 F (36.4 C) (01/07 0510) Pulse Rate:  [68-94] 85 (01/07 0510) Resp:  [15-25] 18 (01/07 0510) BP: (95-118)/(52-72) 103/56 (01/07 0510) SpO2:  [90 %-97 %] 92 % (01/07 0510) FiO2 (%):  [0 %] 0 % (01/06 2008) Weight:  [62.4 kg (137 lb 9.1 oz)-65.8 kg (145 lb)] 62.4 kg (137 lb 9.1 oz) (01/06 2045) Weight change:     Intake/Output from previous day: 01/06 0701 - 01/07 0700 In: 1741.7 [I.V.:1391.7; IV Piggyback:350] Out: -   PHYSICAL EXAM Constitutional: He is cachectic.  Eyes: Pupils react.  Ears nose mouth and throat: His mucous membranes are dry.  Cardiovascular: His heart is regular with normal heart sounds.  Respiratory: He has generally diminished breath sounds and has some wheezing and rhonchi gastrointestinal: His abdomen  is scaphoid.  Sluggish bowel sounds.  Musculoskeletal: He is generally weak in his upper and lower extremities.  Skin: Poor skin turgor.  Neurological: No focal abnormalities.  Psychiatric: He seems anxious  Lab Results: Basic Metabolic Panel: Recent Labs    02/27/2017 1335 02/12/17 0447  NA 142 140  K 4.1 3.8  CL 100* 100*  CO2 29 28  GLUCOSE 108* 77  BUN 32* 33*  CREATININE 0.98 0.98  CALCIUM 8.2* 7.9*   Liver Function Tests: Recent Labs    02/10/2017 1335  AST 94*  ALT 30  ALKPHOS 577*  BILITOT 2.0*  PROT 5.2*  ALBUMIN 1.5*   No results for input(s): LIPASE, AMYLASE in the last 72 hours. No results for input(s): AMMONIA in the last 72 hours. CBC: Recent Labs    02/17/2017 1335 02/12/17 0447  WBC 13.6* 10.7*  NEUTROABS 11.3*  --   HGB 9.9* 9.8*  HCT 32.7* 31.5*  MCV 97.6 96.0  PLT 203 198    Cardiac Enzymes: Recent Labs    02/06/2017 1335 02/18/2017 2257 02/12/17 0447  TROPONINI 0.10* 0.16* 0.14*   BNP: No results for input(s): PROBNP in the last 72 hours. D-Dimer: No results for input(s): DDIMER in the last 72 hours. CBG: No results for input(s): GLUCAP in the last 72 hours. Hemoglobin A1C: No results for input(s): HGBA1C in the last 72 hours. Fasting Lipid Panel: No results for input(s): CHOL, HDL, LDLCALC, TRIG, CHOLHDL, LDLDIRECT in the last 72 hours. Thyroid Function Tests: No results for input(s): TSH, T4TOTAL, FREET4, T3FREE, THYROIDAB in the last 72 hours. Anemia Panel: No results for input(s): VITAMINB12, FOLATE, FERRITIN, TIBC, IRON, RETICCTPCT in the last 72 hours. Coagulation: No results for input(s): LABPROT, INR in the last 72 hours. Urine Drug Screen: Drugs of Abuse  No results found for: LABOPIA, COCAINSCRNUR, LABBENZ, AMPHETMU, THCU, LABBARB  Alcohol Level: No results for input(s): ETH in the last 72 hours. Urinalysis: Recent Labs    02/24/2017 1545  COLORURINE AMBER*  LABSPEC 1.020  PHURINE 5.0  GLUCOSEU NEGATIVE  HGBUR NEGATIVE  BILIRUBINUR SMALL*  KETONESUR NEGATIVE  PROTEINUR 30*  NITRITE NEGATIVE  LEUKOCYTESUR NEGATIVE   Misc. Labs:   ABGS: No results for input(s): PHART, PO2ART, TCO2, HCO3 in the last 72 hours.  Invalid input(s): PCO2   MICROBIOLOGY: Recent Results (from the past 240 hour(s))  Culture, blood (routine x 2)     Status: None (Preliminary result)   Collection Time: 02/18/2017  6:04 PM  Result Value Ref Range Status   Specimen Description BLOOD PORT  Final   Special Requests   Final    BOTTLES DRAWN AEROBIC AND ANAEROBIC Blood Culture adequate volume   Culture NO GROWTH < 12 HOURS  Final   Report Status PENDING  Incomplete  Culture, blood (routine x 2)     Status: None (Preliminary result)   Collection Time: 02/10/2017  7:14 PM  Result Value Ref Range Status   Specimen Description LEFT ANTECUBITAL  Final    Special Requests   Final    BOTTLES DRAWN AEROBIC AND ANAEROBIC Blood Culture adequate volume   Culture NO GROWTH < 12 HOURS  Final   Report Status PENDING  Incomplete    Studies/Results: Dg Pelvis 1-2 Views  Result Date: 03/07/2017 CLINICAL DATA:  Pain after fall 2 days ago. EXAM: PELVIS - 1-2 VIEW COMPARISON:  None. FINDINGS: The left hip is internally rotated and poorly evaluated. No obvious fracture. No right hip fracture identified on this single frontal view.  No pelvic bone fractures are noted. IMPRESSION: Evaluation of the left hip is limited due to internal rotation. However, no fractures are seen on this study. Dedicated imaging could be obtained if the hips if clinical concern persists. Electronically Signed   By: Dorise Bullion III M.D   On: 02/28/2017 19:56   Ct Head Wo Contrast  Result Date: 02/06/2017 CLINICAL DATA:  Encephalopathy.  Fall 5 days ago. EXAM: CT HEAD WITHOUT CONTRAST TECHNIQUE: Contiguous axial images were obtained from the base of the skull through the vertex without intravenous contrast. COMPARISON:  None. FINDINGS: Brain: No subdural, epidural, or subarachnoid hemorrhage. Cerebellum, brainstem, and basal cisterns are normal. Ventricles and sulci are unremarkable. No mass effect or midline shift. White matter changes identified. No acute cortical ischemia or infarct. Vascular: No hyperdense vessel or unexpected calcification. Skull: Normal. Negative for fracture or focal lesion. Sinuses/Orbits: No acute finding. Other: None. IMPRESSION: No acute intracranial abnormality. Electronically Signed   By: Dorise Bullion III M.D   On: 03/06/2017 19:48   Ct Chest W Contrast  Result Date: 02/18/2017 CLINICAL DATA:  Shortness of breath and fever EXAM: CT CHEST WITH CONTRAST TECHNIQUE: Multidetector CT imaging of the chest was performed during intravenous contrast administration. CONTRAST:  52mL ISOVUE-300 IOPAMIDOL (ISOVUE-300) INJECTION 61% COMPARISON:  11/20/2016 FINDINGS:  Cardiovascular: Heart size upper normal. No pericardial effusion. Atherosclerotic calcification is noted in the wall of the thoracic aorta. Right Port-A-Cath tip is positioned in the upper right atrium. Mediastinum/Nodes: scattered small mediastinal lymph nodes evident. No gross hilar lymphadenopathy. The esophagus has normal imaging features. There is no axillary lymphadenopathy. Lungs/Pleura: Dense right upper lobe airspace consolidation has central cavitation with air-fluid levels. This is new in the interval. There is right lower lobe collapse/ consolidation with moderate right pleural effusion. 1.7 cm nodule anterior left lower lobe has decreased from 2.2 cm previously. There is left lower lobe collapse/ consolidation with small left pleural effusion. 5 mm left upper lobe pulmonary nodule is similar. Other scattered tiny bilateral pulmonary nodules are evident. Upper Abdomen: Bulky metastatic disease again noted in the liver. There is probably some ascites and edema in the soft tissues the liver but this area is obscured by streak artifact from the patient's arms. Musculoskeletal: Bone windows reveal no worrisome lytic or sclerotic osseous lesions. IMPRESSION: 1. Large right upper lobe necrotic consolidative process, likely pneumonia with lung abscess or necrotic pneumonia. Rapid interval appearance makes metastatic involvement less likely. 2. Bilateral pulmonary nodules with slight decrease in size of the dominant left lower lobe pulmonary nodule described on the prior study. 3. Bilateral lower lobe collapse/consolidation with small to moderate right and small left pleural effusions. 4. Bulky hepatic metastases. 5. Probable ascites in the upper abdomen although upper abdomen not well seen due to streak artifact from the patient's adjacent arms. Abdomen and pelvis CT with oral and IV contrast could be used to further evaluate as clinically warranted. Electronically Signed   By: Misty Stanley M.D.   On:  02/12/2017 15:31   Dg Chest Portable 1 View  Result Date: 02/10/2017 CLINICAL DATA:  Shortness of breath, productive cough, fever since Friday, history diabetes mellitus, hypertension, hiatal hernia, colon cancer EXAM: PORTABLE CHEST 1 VIEW COMPARISON:  Portable exam 1142 hours compared to 11/20/2016 CT chest FINDINGS: RIGHT jugular Port-A-Cath with tip projecting over SVC. Normal heart size and mediastinal contours. Extensive opacity RIGHT upper lobe which could represent infiltrate or progressive metastases. Persistent nodular density LEFT base corresponding to known metastasis. Bibasilar opacities question  atelectasis versus infiltrate. RIGHT pleural effusion not excluded. No pneumothorax. IMPRESSION: Persistent LEFT lung base nodule/metastasis. Bibasilar atelectasis versus infiltrate with progressive opacity in the RIGHT upper lobe which could represent acute infiltrate or progressive metastatic disease; CT chest with contrast recommended for further assessment. Electronically Signed   By: Lavonia Dana M.D.   On: 02/24/2017 12:14    Medications:  Prior to Admission:  Medications Prior to Admission  Medication Sig Dispense Refill Last Dose  . aspirin EC 81 MG tablet Take 81 mg by mouth daily.   02/10/2017 at Unknown time  . Bevacizumab (AVASTIN IV) Inject into the vein.   Taking  . dexamethasone (DECADRON) 4 MG tablet Take 2 tablets (8 mg total) by mouth daily. Start the day after chemotherapy for 2 days. Take with food. 30 tablet 1 Taking  . diphenoxylate-atropine (LOMOTIL) 2.5-0.025 MG tablet Take 1 tablet by mouth 4 (four) times daily as needed for diarrhea or loose stools. 60 tablet 0 02/17/2017 at Unknown time  . FLUOROURACIL IV Inject into the vein.   Taking  . gemfibrozil (LOPID) 600 MG tablet Take 600 mg by mouth daily.   Past Week at Unknown time  . leucovorin in dextrose 5 % 250 mL Inject into the vein once.   Taking  . lidocaine-prilocaine (EMLA) cream Apply to affected area once 30 g 3  Taking  . Misc. Devices MISC Please provide patient with bath / shower chair 1 each 0 Taking  . Misc. Devices MISC Please provide patient with adult incontinence briefs. 180 each 11 Taking  . Misc. Devices MISC Please provide patient with incontinence pads 180 each 11 Taking  . morphine (MS CONTIN) 15 MG 12 hr tablet Take 1 or 2 tabs by mouth every 12 hours as directed. 90 tablet 0 02/17/2017 at Unknown time  . Multiple Vitamin (MULTIVITAMIN WITH MINERALS) TABS tablet Take 1 tablet by mouth daily.   Past Week at Unknown time  . ondansetron (ZOFRAN) 8 MG tablet Take 1 tablet (8 mg total) by mouth 2 (two) times daily as needed for refractory nausea / vomiting. Start on day 3 after chemotherapy. 30 tablet 1 Taking  . OXALIPLATIN IV Inject into the vein.   Taking  . oxyCODONE (OXY IR/ROXICODONE) 5 MG immediate release tablet Take 1 or 2 tabs every 4 hours as needed for pain 90 tablet 0   . pantoprazole (PROTONIX) 40 MG tablet Take 40 mg by mouth daily.    02/10/2017 at Unknown time  . potassium chloride SA (K-DUR,KLOR-CON) 20 MEQ tablet Take 1 tablet (20 mEq total) by mouth 2 (two) times daily. 60 tablet 1 Past Week at Unknown time  . prochlorperazine (COMPAZINE) 10 MG tablet Take 1 tablet (10 mg total) by mouth every 6 (six) hours as needed (Nausea or vomiting). 30 tablet 1 Taking  . promethazine (PHENERGAN) 12.5 MG tablet Take 12.5 mg by mouth every 6 (six) hours as needed for nausea or vomiting.   Taking  . Sennosides (SENNA) 8.6 MG CAPS Take 1 capsule by mouth daily as needed.  0 Taking  . vitamin B-12 (CYANOCOBALAMIN) 1000 MCG tablet Take 1,000 mcg by mouth daily.   02/10/2017 at Unknown time   Scheduled: . aspirin EC  81 mg Oral Daily  . enoxaparin (LOVENOX) injection  40 mg Subcutaneous Q24H  . feeding supplement (GLUCERNA SHAKE)  237 mL Oral TID BM  . gemfibrozil  600 mg Oral Daily  . mouth rinse  15 mL Mouth Rinse BID  . morphine  15 mg Oral Q12H  . pantoprazole  40 mg Oral Daily    Continuous: . piperacillin-tazobactam (ZOSYN)  IV Stopped (02/12/17 9892)  . vancomycin     JJH:[id number]  Assesment: I agree he has necrotic pneumonia.  I think current antibiotics are entirely appropriate.  He has stage IV metastatic colon cancer with metastatic disease apparently to liver and lung.  He is cachectic with severe protein calorie malnutrition.  He is complaining of pain.  He has sustained release morphine ordered but does not have any further doses of a as needed. Principal Problem:   Necrotic pneumonia ([place]) Active Problems:   Metastatic colon cancer to liver ([place])   Severe protein-calorie malnutrition ([place])   Diabetes mellitus without complication [facility])   Palliative care encounter    Plan: Continue current treatments.  Prognosis is very poor    LOS: 1 day   Jowanna Loeffler L 02/12/2017, 8:24 AM

## 2017-02-12 NOTE — Consult Note (Signed)
[place] Nurse wound consult note Reason for Consult:deep tissue injury from prolonged down time after a fall and complicated by moisture associated skin damage from continuous loose stools.  Wound type:moisture/pressure/trauma/full thickness injury Pressure Injury POA: Yes Measurement: 7 cm x 6 cm maroon discoloration with sloughing epithelium noted.  Extends across sacrum and left buttocks Wound HKU:VJDYNX discoloration Drainage (amount, consistency, odor) none noted Periwound:intact Dressing procedure/placement/frequency: Cleanse wounds to sacrum and buttocks with soap and water.  Apply Gerhardts butt paste twice daily and PRN soilage.  Will not follow at this time.  Please re-consult if needed.  Domenic Moras RN BSN [place] Pager [phone]

## 2017-02-14 ENCOUNTER — Ambulatory Visit (HOSPITAL_COMMUNITY): Payer: Medicare PPO

## 2017-02-14 ENCOUNTER — Ambulatory Visit (HOSPITAL_COMMUNITY): Payer: Medicare PPO | Admitting: Hematology and Oncology

## 2017-02-16 ENCOUNTER — Encounter (HOSPITAL_COMMUNITY): Payer: Self-pay

## 2017-02-16 LAB — CULTURE, BLOOD (ROUTINE X 2)
CULTURE: NO GROWTH
Culture: NO GROWTH
SPECIAL REQUESTS: ADEQUATE
Special Requests: ADEQUATE

## 2017-02-24 ENCOUNTER — Other Ambulatory Visit: Payer: Self-pay | Admitting: Nurse Practitioner

## 2017-03-09 NOTE — Progress Notes (Signed)
Patient family wanted patient to be removed form oxygen and given morphine to easy pain. Patient was on a NREBRE with a saturation of 94% at 10 lpm. Family had been adjusting flowmeter stated patient wasn't comfortable with 15lpm blowing in [place]. Patient is on monitor and saturation have continue to fall once O2 had been removed.

## 2017-03-09 NOTE — Progress Notes (Signed)
DNR patient on comfort care, 2 RNs verified death. Time of death 80. MD made aware. [facility] called, spoke to [employer] who fully released patient. Reference #[id number]

## 2017-03-09 NOTE — Progress Notes (Signed)
Patient was admitted following a fall that occurred 5 days prior to admission. Cory Robinson- Medical examiner was contacted to verify if this was a medical examiner case. Due to patient's history of colon cancer with mets to the lung and liver, he felt that this did not justify being a medical examiner case.

## 2017-03-09 NOTE — Death Summary Note (Signed)
DEATH SUMMARY   Patient Details  Name: Cory Robinson MRN: [id number] DOB: [birth year]  Admission/Discharge Information   Admit Date:  2017/03/03  Date of Death: Date of Death: 03-05-2017  Time of Death: Time of Death: 0342  Length of Stay: 2  Referring Physician: Patient, [name] Per   Reason(s) for Hospitalization  dyspnea  Diagnoses  Preliminary cause of death: necrotizing pneumonia Secondary Diagnoses (including complications and co-morbidities):  Necrotizing pneumonia -2017-03-03 CT chest as discussed above -Plan to treat as an infectious process although I am concerned this may represent a necrotic tumor -Continue vancomycin and zosyn -suspect component of aspiration pneumonia -start BDs -start IVF with KCl -Palliative medicine consult was obtained. -After discussion with the patient's son, the initial decision was to discharge the patient to residential hospice if he remained stable.  Son agreed to change the patient's focus of care to that of full comfort -On the afternoon of 02/12/2017, the patient's clinical condition and respiratory status declined -After continued discussion with the patient's son, it was felt in the patient's best interest to transition his focus of care to full comfort as he would be unstable to transition to residential hospice. -All medications with curative intent were discontinued.  Palliative medicine continued to follow for symptom management.  Dysphagia -Speech therapy evaluation initially ordered -holding po meds until evaluated -Once the patient's focus of care was changed to focus on full comfort, speech therapy evaluation was canceled and comfort feeds were initiated.  Elevated troponin -Likely demand ischemia in the setting of infectious process -Personally reviewed EKG--sinus rhythm, no concerning ST-T wave changes -Trend troponin  Metastatic colon cancer -Metastasis to liver and lung -Last chemotherapy 01/31/2017 -Palliative medicine  consultation  Diarrhea -Likely secondary to 5-FU -C. difficile has been ordered  Pain associated with neoplastic disease -Restart MS Contin when able to tolerate po -Start IV Dilaudid  Severe protein malnutrition -Nutrition consult -Continue supplements    Brief Hospital Course (including significant findings, care, treatment, and services provided and events leading to death)  Cory Robinson is a  72 year old male with a history of metastatic colon cancer to the liver and lung, diabetes mellitus, and loose stools presenting with 1 week history of increasing lethargy and generalized weakness.  The patient was found on the bathroom floor on New Year's Day.  It is unclear the circumstances around his fall whether it was mechanical or syncope.  The patient is unable to provide any history secondary to his lethargy and altered mental status.  Nevertheless, the patient was brought in by his son secondary to increasing lethargy to the point where he is unable to get out of bed.  The patient has also been having increasing cough with green sputum and had a fever 101.0 F on 02/09/2017.  In the emergency department, the patient was afebrile hemodynamically stable with oxygen saturation 90% room air.  CT of the chest revealed dense right upper lobe consolidation with central cavitation with air-fluid levels.  There is also right lower lobe and left lower lobe collapse with consolidation.  The patient's son noted that the patient has been choking on his feet for the past 3-4 days.  The patient was initially started on intravenous fluids and IV antibiotics.  Palliative medicine was consulted. -After discussion with the patient's son, the initial decision was to discharge the patient to residential hospice if he remained stable.  Son agreed to change the patient's focus of care to that of full comfort -On the afternoon of 02/12/2017, the  patient's clinical condition and respiratory status declined -After  continued discussion with the patient's son, it was felt in the patient's best interest to transition his focus of care to full comfort as he would be unstable to transition to residential hospice. -All medications with curative intent were discontinued.  Palliative medicine continued to follow for symptom management.     Pertinent Labs and Studies  Significant Diagnostic Studies Dg Pelvis 1-2 Views  Result Date: 02/08/2017 CLINICAL DATA:  Pain after fall 2 days ago. EXAM: PELVIS - 1-2 VIEW COMPARISON:  None. FINDINGS: The left hip is internally rotated and poorly evaluated. No obvious fracture. No right hip fracture identified on this single frontal view. No pelvic bone fractures are noted. IMPRESSION: Evaluation of the left hip is limited due to internal rotation. However, no fractures are seen on this study. Dedicated imaging could be obtained if the hips if clinical concern persists. Electronically Signed   By: Dorise Bullion III M.D   On: 02/25/2017 19:56   Ct Head Wo Contrast  Result Date: 02/26/2017 CLINICAL DATA:  Encephalopathy.  Fall 5 days ago. EXAM: CT HEAD WITHOUT CONTRAST TECHNIQUE: Contiguous axial images were obtained from the base of the skull through the vertex without intravenous contrast. COMPARISON:  None. FINDINGS: Brain: No subdural, epidural, or subarachnoid hemorrhage. Cerebellum, brainstem, and basal cisterns are normal. Ventricles and sulci are unremarkable. No mass effect or midline shift. White matter changes identified. No acute cortical ischemia or infarct. Vascular: No hyperdense vessel or unexpected calcification. Skull: Normal. Negative for fracture or focal lesion. Sinuses/Orbits: No acute finding. Other: None. IMPRESSION: No acute intracranial abnormality. Electronically Signed   By: Dorise Bullion III M.D   On: 03/02/2017 19:48   Ct Chest W Contrast  Result Date: 02/06/2017 CLINICAL DATA:  Shortness of breath and fever EXAM: CT CHEST WITH CONTRAST TECHNIQUE:  Multidetector CT imaging of the chest was performed during intravenous contrast administration. CONTRAST:  54mL ISOVUE-300 IOPAMIDOL (ISOVUE-300) INJECTION 61% COMPARISON:  11/20/2016 FINDINGS: Cardiovascular: Heart size upper normal. No pericardial effusion. Atherosclerotic calcification is noted in the wall of the thoracic aorta. Right Port-A-Cath tip is positioned in the upper right atrium. Mediastinum/Nodes: scattered small mediastinal lymph nodes evident. No gross hilar lymphadenopathy. The esophagus has normal imaging features. There is no axillary lymphadenopathy. Lungs/Pleura: Dense right upper lobe airspace consolidation has central cavitation with air-fluid levels. This is new in the interval. There is right lower lobe collapse/ consolidation with moderate right pleural effusion. 1.7 cm nodule anterior left lower lobe has decreased from 2.2 cm previously. There is left lower lobe collapse/ consolidation with small left pleural effusion. 5 mm left upper lobe pulmonary nodule is similar. Other scattered tiny bilateral pulmonary nodules are evident. Upper Abdomen: Bulky metastatic disease again noted in the liver. There is probably some ascites and edema in the soft tissues the liver but this area is obscured by streak artifact from the patient's arms. Musculoskeletal: Bone windows reveal no worrisome lytic or sclerotic osseous lesions. IMPRESSION: 1. Large right upper lobe necrotic consolidative process, likely pneumonia with lung abscess or necrotic pneumonia. Rapid interval appearance makes metastatic involvement less likely. 2. Bilateral pulmonary nodules with slight decrease in size of the dominant left lower lobe pulmonary nodule described on the prior study. 3. Bilateral lower lobe collapse/consolidation with small to moderate right and small left pleural effusions. 4. Bulky hepatic metastases. 5. Probable ascites in the upper abdomen although upper abdomen not well seen due to streak artifact from the  patient's adjacent  arms. Abdomen and pelvis CT with oral and IV contrast could be used to further evaluate as clinically warranted. Electronically Signed   By: Misty Stanley M.D.   On: 02/16/2017 15:31   Dg Chest Portable 1 View  Result Date: 02/16/2017 CLINICAL DATA:  Shortness of breath, productive cough, fever since Friday, history diabetes mellitus, hypertension, hiatal hernia, colon cancer EXAM: PORTABLE CHEST 1 VIEW COMPARISON:  Portable exam 1142 hours compared to 11/20/2016 CT chest FINDINGS: RIGHT jugular Port-A-Cath with tip projecting over SVC. Normal heart size and mediastinal contours. Extensive opacity RIGHT upper lobe which could represent infiltrate or progressive metastases. Persistent nodular density LEFT base corresponding to known metastasis. Bibasilar opacities question atelectasis versus infiltrate. RIGHT pleural effusion not excluded. No pneumothorax. IMPRESSION: Persistent LEFT lung base nodule/metastasis. Bibasilar atelectasis versus infiltrate with progressive opacity in the RIGHT upper lobe which could represent acute infiltrate or progressive metastatic disease; CT chest with contrast recommended for further assessment. Electronically Signed   By: Lavonia Dana M.D.   On: 03/08/2017 12:14    Microbiology Recent Results (from the past 240 hour(s))  Culture, blood (routine x 2)     Status: None (Preliminary result)   Collection Time: 02/16/2017  6:04 PM  Result Value Ref Range Status   Specimen Description BLOOD PORTA CATH DRAWN BY RN  Final   Special Requests   Final    BOTTLES DRAWN AEROBIC AND ANAEROBIC Blood Culture adequate volume   Culture NO GROWTH 2 DAYS  Final   Report Status PENDING  Incomplete  Culture, blood (routine x 2)     Status: None (Preliminary result)   Collection Time: 02/15/2017  7:14 PM  Result Value Ref Range Status   Specimen Description LEFT ANTECUBITAL  Final   Special Requests   Final    BOTTLES DRAWN AEROBIC AND ANAEROBIC Blood Culture adequate  volume   Culture NO GROWTH 2 DAYS  Final   Report Status PENDING  Incomplete    Lab Basic Metabolic Panel: Recent Labs  Lab 02/23/2017 1335 02/12/17 0447  NA 142 140  K 4.1 3.8  CL 100* 100*  CO2 29 28  GLUCOSE 108* 77  BUN 32* 33*  CREATININE 0.98 0.98  CALCIUM 8.2* 7.9*   Liver Function Tests: Recent Labs  Lab 03/02/2017 1335  AST 94*  ALT 30  ALKPHOS 577*  BILITOT 2.0*  PROT 5.2*  ALBUMIN 1.5*   No results for input(s): LIPASE, AMYLASE in the last 168 hours. No results for input(s): AMMONIA in the last 168 hours. CBC: Recent Labs  Lab 02/28/2017 1335 02/12/17 0447  WBC 13.6* 10.7*  NEUTROABS 11.3*  --   HGB 9.9* 9.8*  HCT 32.7* 31.5*  MCV 97.6 96.0  PLT 203 198   Cardiac Enzymes: Recent Labs  Lab 02/14/2017 1335 02/07/2017 2257 02/12/17 0447  TROPONINI 0.10* 0.16* 0.14*   Sepsis Labs: Recent Labs  Lab 02/17/2017 1335 03/06/2017 2257 02/12/17 0447  WBC 13.6*  --  10.7*  LATICACIDVEN  --  2.1* 1.8    Procedures/Operations     Aquan Kope Feb 23, 2017, 5:14 PM

## 2017-03-09 DEATH — deceased

## 2018-04-06 IMAGING — CT CT HEAD W/O CM
3 series · 16 of 47 positions shown, 19 images · non-contrast
Comparison: None.

CLINICAL DATA: Encephalopathy.  Fall 5 days ago.

EXAM:
CT HEAD WITHOUT CONTRAST
TECHNIQUE: Contiguous axial images were obtained from the base of the skull
through the vertex without intravenous contrast.

[Series 2: head wo · axial · 0.43mm/px · z∈[+1038,+1173]mm · 10 of 33 slices shown, 13 images]
[im 3/33  brain]
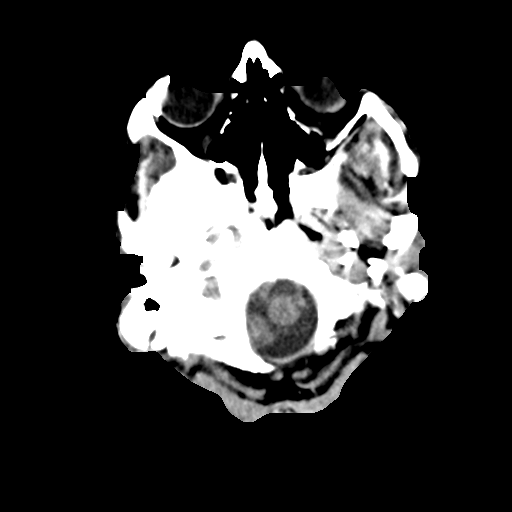
[im 3/33  bone]
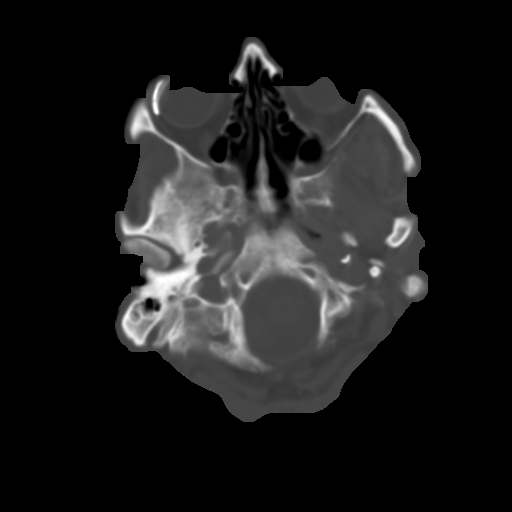
[im 6/33  brain]
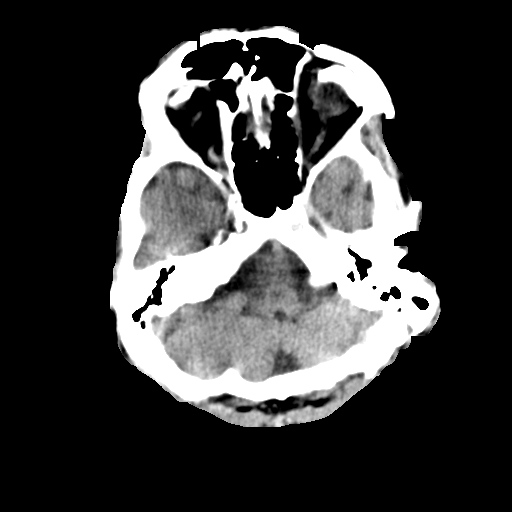
[im 9/33  brain]
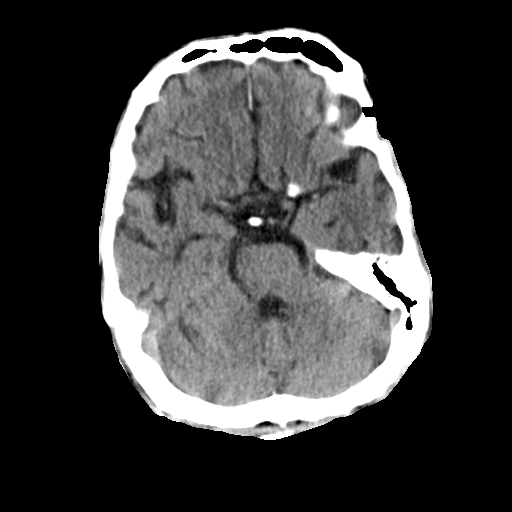
[im 12/33  brain]
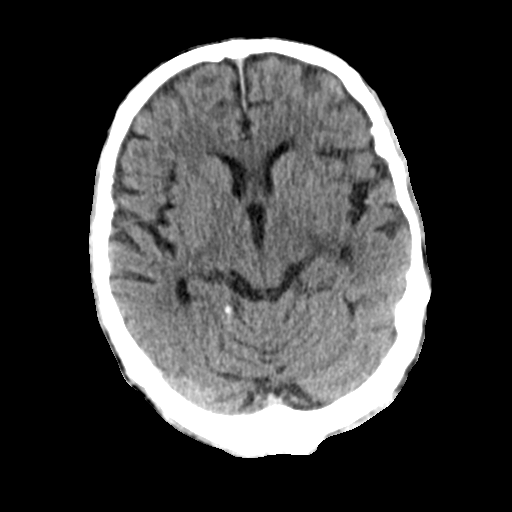
[im 15/33  brain]
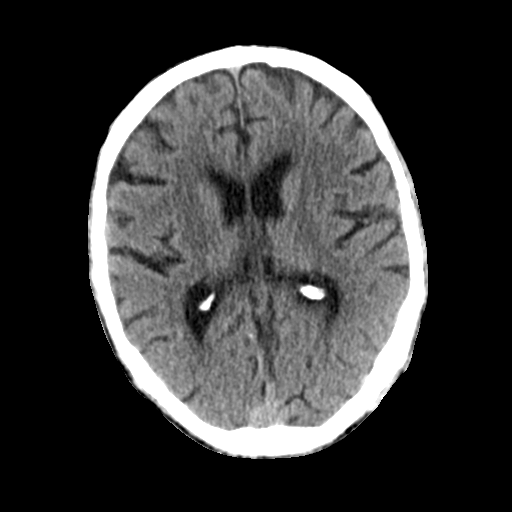
[im 15/33  bone]
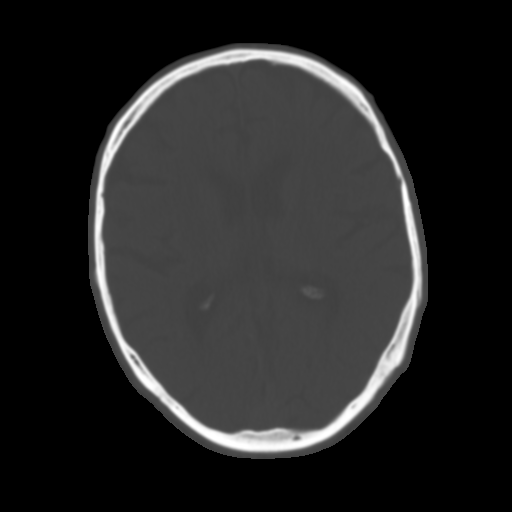
[im 18/33  brain]
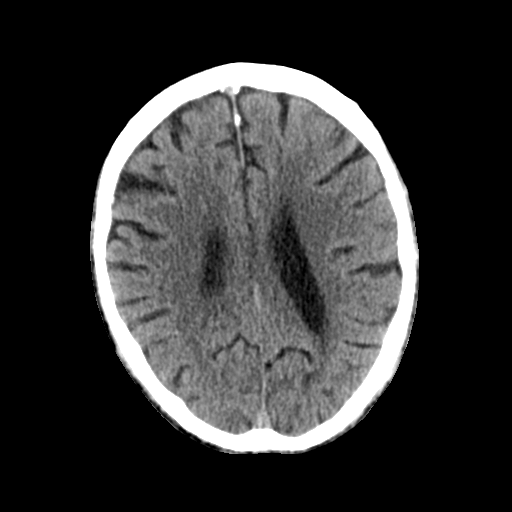
[im 21/33  brain]
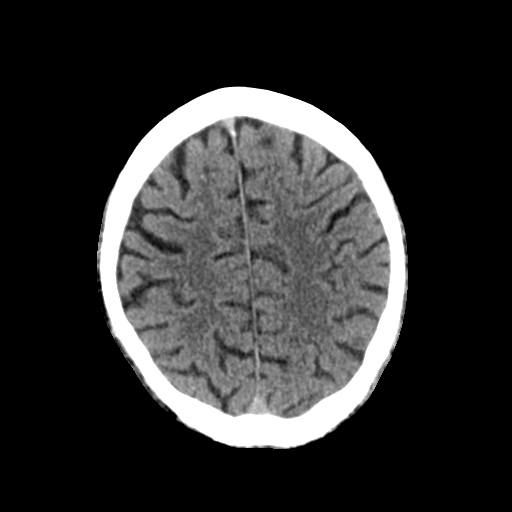
[im 25/33  brain]
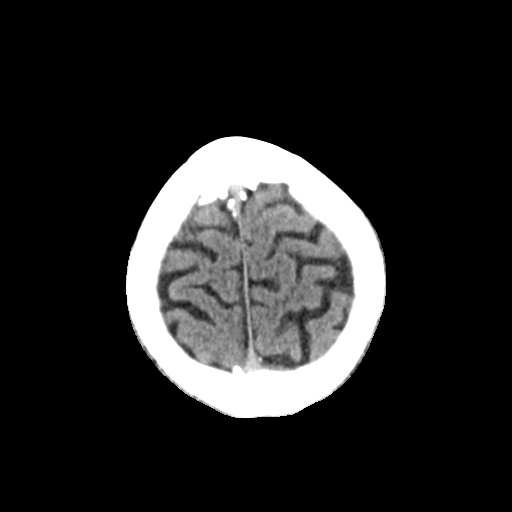
[im 27/33  brain]
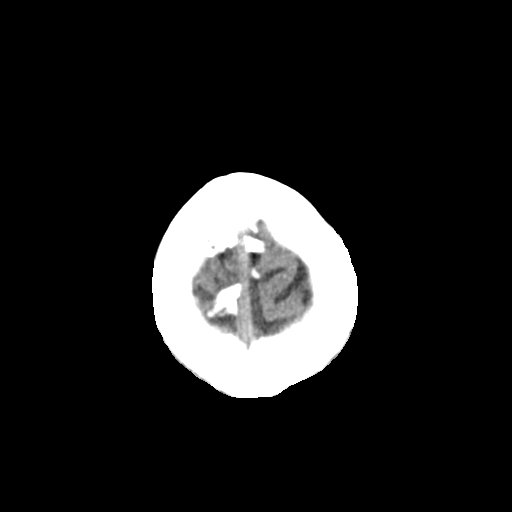
[im 27/33  bone]
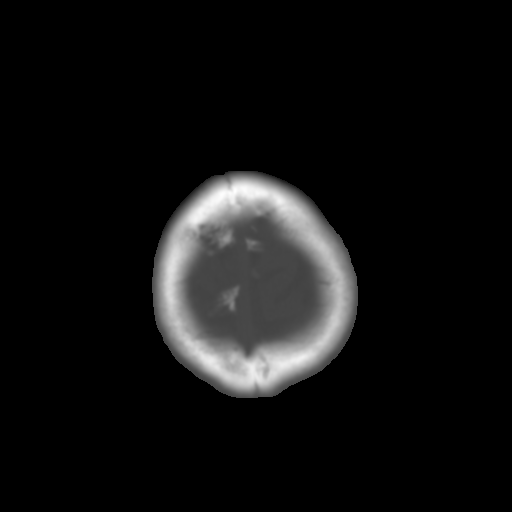
[im 30/33  brain]
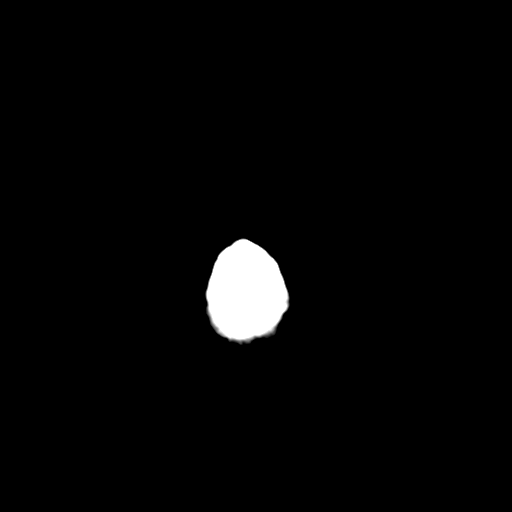

[Series 4: coronal soft tissue · coronal · 0.31mm/px · 3 of 67 slices shown]
[im 23/67  brain]
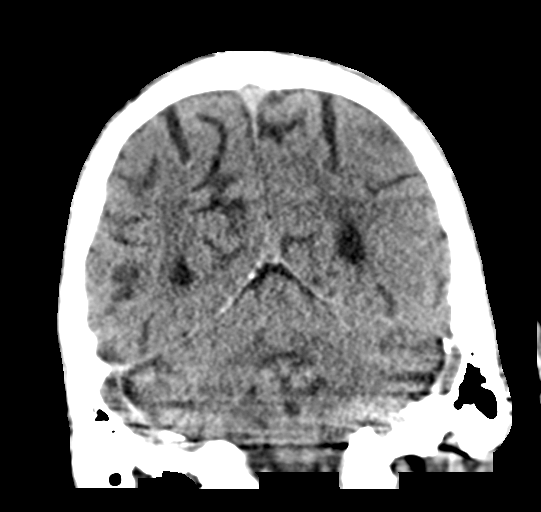
[im 30/67  brain]
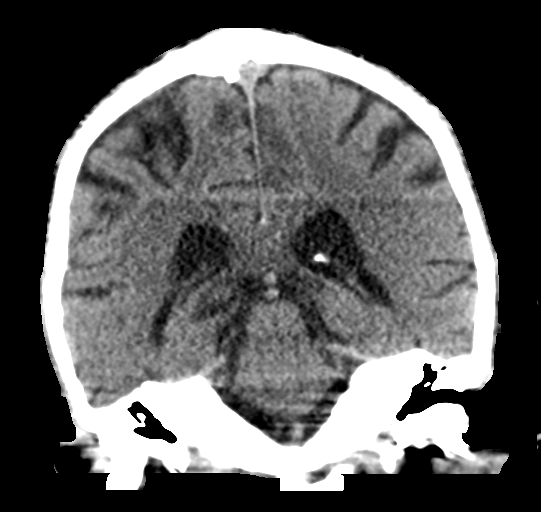
[im 37/67  brain]
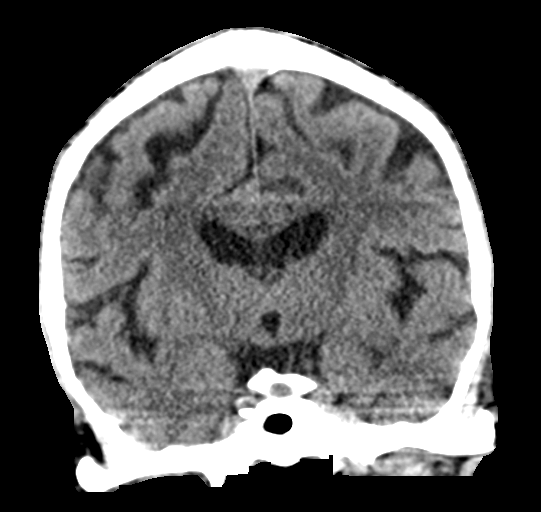

[Series 5: sagittal soft tissue · sagittal · 0.33mm/px · 3 of 54 slices shown]
[im 18/54  brain]
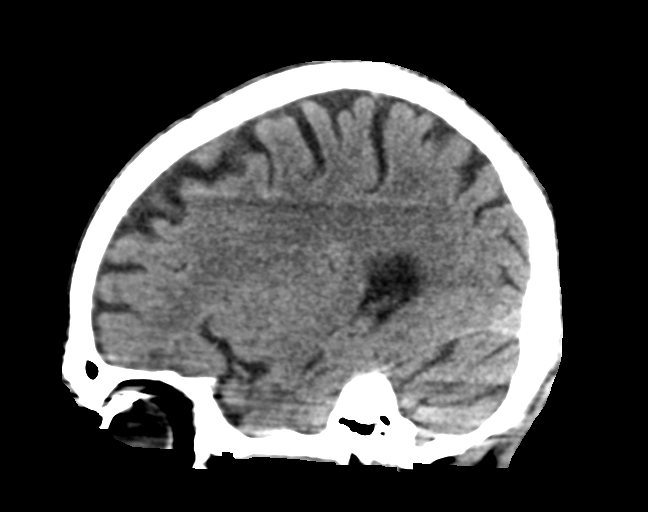
[im 27/54  brain]
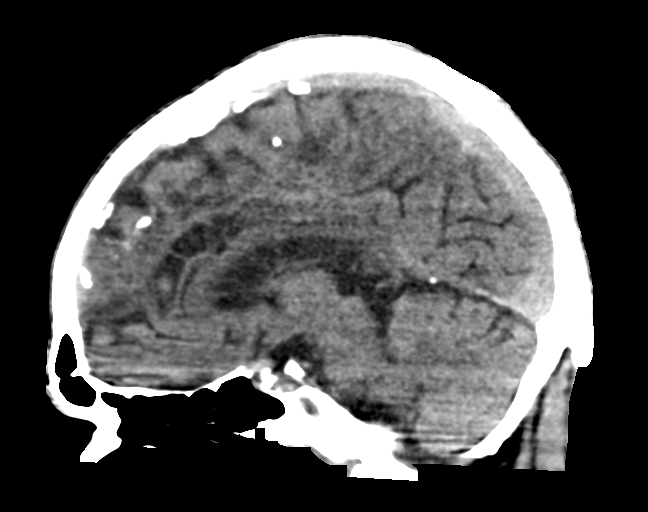
[im 36/54  brain]
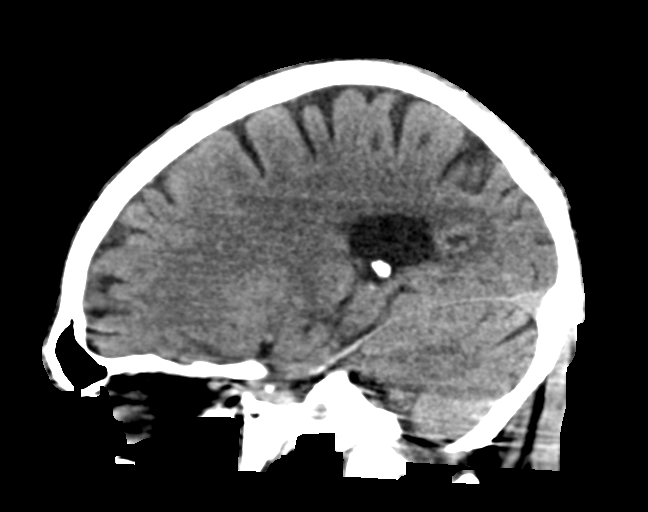

[16 of 47 positions shown; findings below may reference images not displayed]

FINDINGS: Brain: No subdural, epidural, or subarachnoid hemorrhage.
Cerebellum, brainstem, and basal cisterns are normal. Ventricles and
sulci are unremarkable. No mass effect or midline shift. White
matter changes identified. No acute cortical ischemia or infarct.

Vascular: No hyperdense vessel or unexpected calcification.

Skull: Normal. Negative for fracture or focal lesion.

Sinuses/Orbits: No acute finding.

Other: None.
IMPRESSION: No acute intracranial abnormality.

## 2018-04-06 IMAGING — CR DG PELVIS 1-2V
1 series · 1 of 1 positions shown · non-contrast
Comparison: None.

CLINICAL DATA: Pain after fall 2 days ago.

EXAM:
PELVIS - 1-2 VIEW

[ap]
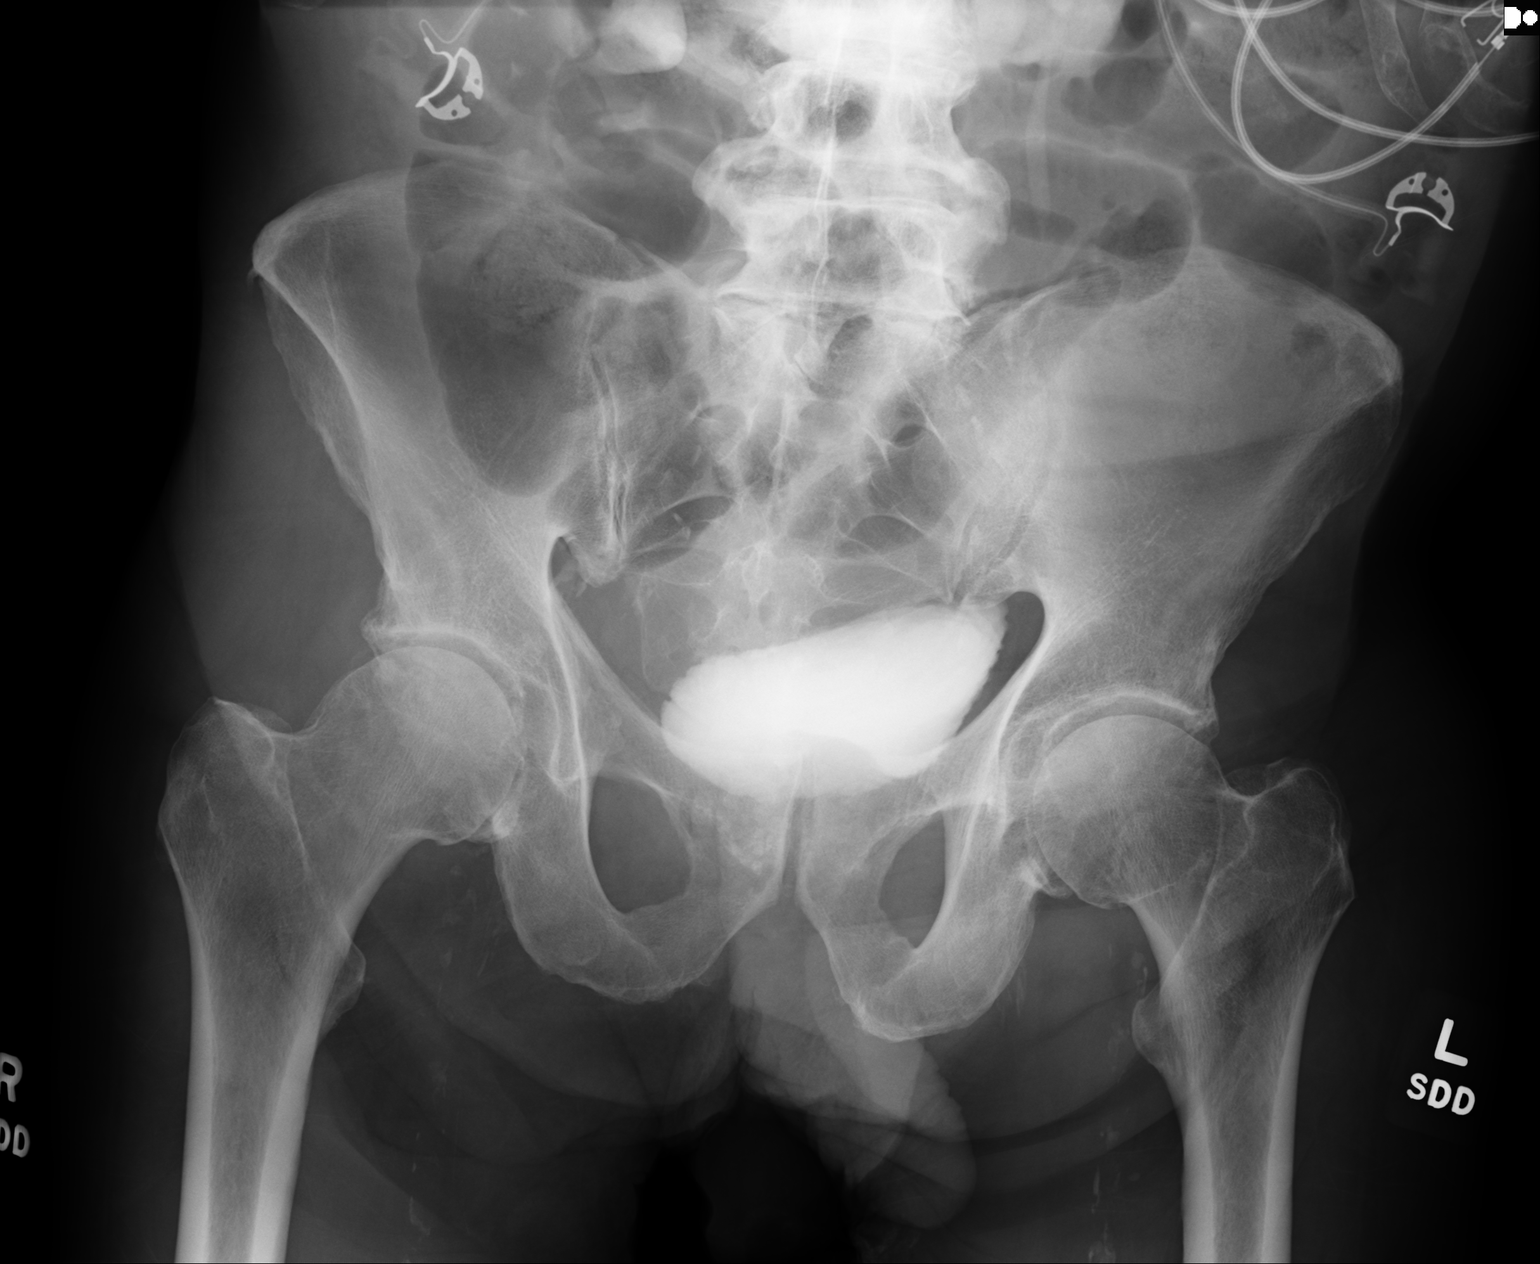

[1 of 1 positions shown; findings below may reference images not displayed]

FINDINGS: The left hip is internally rotated and poorly evaluated. No obvious
fracture. No right hip fracture identified on this single frontal
view. No pelvic bone fractures are noted.
IMPRESSION: Evaluation of the left hip is limited due to internal rotation.
However, no fractures are seen on this study. Dedicated imaging
could be obtained if the hips if clinical concern persists.
